# Patient Record
Sex: Male | Born: 1965 | Race: Asian | Hispanic: No | Marital: Married | Smoking: Never smoker
Health system: Southern US, Community
[De-identification: ages and names within clinical notes are randomized; demographics above are authoritative.]

## PROBLEM LIST (undated history)

## (undated) DIAGNOSIS — K5900 Constipation, unspecified: Secondary | ICD-10-CM

## (undated) DIAGNOSIS — R7303 Prediabetes: Secondary | ICD-10-CM

## (undated) DIAGNOSIS — M1990 Unspecified osteoarthritis, unspecified site: Secondary | ICD-10-CM

## (undated) DIAGNOSIS — E291 Testicular hypofunction: Secondary | ICD-10-CM

## (undated) DIAGNOSIS — D352 Benign neoplasm of pituitary gland: Secondary | ICD-10-CM

## (undated) DIAGNOSIS — R1011 Right upper quadrant pain: Secondary | ICD-10-CM

## (undated) DIAGNOSIS — N281 Cyst of kidney, acquired: Secondary | ICD-10-CM

## (undated) DIAGNOSIS — E221 Hyperprolactinemia: Secondary | ICD-10-CM

## (undated) DIAGNOSIS — E785 Hyperlipidemia, unspecified: Secondary | ICD-10-CM

## (undated) DIAGNOSIS — M5030 Other cervical disc degeneration, unspecified cervical region: Secondary | ICD-10-CM

## (undated) DIAGNOSIS — K760 Fatty (change of) liver, not elsewhere classified: Secondary | ICD-10-CM

## (undated) DIAGNOSIS — D497 Neoplasm of unspecified behavior of endocrine glands and other parts of nervous system: Secondary | ICD-10-CM

## (undated) DIAGNOSIS — R221 Localized swelling, mass and lump, neck: Secondary | ICD-10-CM

## (undated) DIAGNOSIS — L719 Rosacea, unspecified: Secondary | ICD-10-CM

## (undated) DIAGNOSIS — E230 Hypopituitarism: Secondary | ICD-10-CM

## (undated) DIAGNOSIS — R7302 Impaired glucose tolerance (oral): Secondary | ICD-10-CM

## (undated) HISTORY — PX: APPENDECTOMY: SHX54

## (undated) HISTORY — DX: Constipation, unspecified: K59.00

## (undated) HISTORY — DX: Prediabetes: R73.03

## (undated) HISTORY — DX: Other cervical disc degeneration, unspecified cervical region: M50.30

## (undated) HISTORY — DX: Hyperlipidemia, unspecified: E78.5

## (undated) HISTORY — DX: Benign neoplasm of pituitary gland: D35.2

## (undated) HISTORY — DX: Cyst of kidney, acquired: N28.1

## (undated) HISTORY — DX: Fatty (change of) liver, not elsewhere classified: K76.0

## (undated) HISTORY — DX: Rosacea, unspecified: L71.9

## (undated) HISTORY — DX: Unspecified osteoarthritis, unspecified site: M19.90

## (undated) HISTORY — DX: Localized swelling, mass and lump, neck: R22.1

## (undated) HISTORY — DX: Impaired glucose tolerance (oral): R73.02

## (undated) HISTORY — DX: Testicular hypofunction: E29.1

## (undated) HISTORY — DX: Hypopituitarism: E23.0

## (undated) HISTORY — PX: TONSILLECTOMY: SUR1361

## (undated) HISTORY — DX: Hyperprolactinemia: E22.1

---

## 2004-08-07 ENCOUNTER — Ambulatory Visit (HOSPITAL_COMMUNITY): Admission: RE | Admit: 2004-08-07 | Discharge: 2004-08-07 | Payer: Self-pay | Admitting: Internal Medicine

## 2010-03-30 ENCOUNTER — Emergency Department: Payer: Self-pay | Admitting: Surgery

## 2010-04-11 ENCOUNTER — Ambulatory Visit: Payer: Self-pay | Admitting: Anesthesiology

## 2010-06-06 ENCOUNTER — Ambulatory Visit: Payer: Self-pay | Admitting: Anesthesiology

## 2011-08-19 DIAGNOSIS — E221 Hyperprolactinemia: Secondary | ICD-10-CM

## 2011-08-19 DIAGNOSIS — D352 Benign neoplasm of pituitary gland: Secondary | ICD-10-CM | POA: Insufficient documentation

## 2011-08-19 DIAGNOSIS — E230 Hypopituitarism: Secondary | ICD-10-CM

## 2011-08-19 HISTORY — DX: Hyperprolactinemia: E22.1

## 2011-08-19 HISTORY — DX: Benign neoplasm of pituitary gland: D35.2

## 2011-08-19 HISTORY — DX: Hypopituitarism: E23.0

## 2013-06-08 DIAGNOSIS — E291 Testicular hypofunction: Secondary | ICD-10-CM | POA: Insufficient documentation

## 2013-06-08 DIAGNOSIS — R7302 Impaired glucose tolerance (oral): Secondary | ICD-10-CM | POA: Insufficient documentation

## 2013-06-08 HISTORY — DX: Impaired glucose tolerance (oral): R73.02

## 2013-06-08 HISTORY — DX: Testicular hypofunction: E29.1

## 2013-06-12 DIAGNOSIS — R221 Localized swelling, mass and lump, neck: Secondary | ICD-10-CM | POA: Insufficient documentation

## 2013-06-12 HISTORY — DX: Localized swelling, mass and lump, neck: R22.1

## 2013-06-13 DIAGNOSIS — E230 Hypopituitarism: Secondary | ICD-10-CM | POA: Insufficient documentation

## 2013-06-13 HISTORY — DX: Hypopituitarism: E23.0

## 2017-01-19 LAB — HM HEPATITIS C SCREENING LAB: HM HEPATITIS C SCREENING: NEGATIVE

## 2017-03-01 ENCOUNTER — Emergency Department
Admission: EM | Admit: 2017-03-01 | Discharge: 2017-03-01 | Disposition: A | Discharge status: Home / Self Care | Payer: BLUE CROSS/BLUE SHIELD | Attending: Emergency Medicine | Admitting: Emergency Medicine

## 2017-03-01 ENCOUNTER — Telehealth: Payer: Self-pay

## 2017-03-01 ENCOUNTER — Other Ambulatory Visit: Payer: Self-pay

## 2017-03-01 ENCOUNTER — Emergency Department: Payer: BLUE CROSS/BLUE SHIELD

## 2017-03-01 ENCOUNTER — Encounter: Payer: Self-pay | Admitting: Emergency Medicine

## 2017-03-01 DIAGNOSIS — G8929 Other chronic pain: Secondary | ICD-10-CM

## 2017-03-01 DIAGNOSIS — R1011 Right upper quadrant pain: Principal | ICD-10-CM

## 2017-03-01 DIAGNOSIS — K529 Noninfective gastroenteritis and colitis, unspecified: Secondary | ICD-10-CM | POA: Diagnosis not present

## 2017-03-01 DIAGNOSIS — Z79899 Other long term (current) drug therapy: Secondary | ICD-10-CM | POA: Diagnosis not present

## 2017-03-01 DIAGNOSIS — Z7984 Long term (current) use of oral hypoglycemic drugs: Secondary | ICD-10-CM | POA: Insufficient documentation

## 2017-03-01 DIAGNOSIS — R1010 Upper abdominal pain, unspecified: Secondary | ICD-10-CM | POA: Diagnosis not present

## 2017-03-01 DIAGNOSIS — R112 Nausea with vomiting, unspecified: Secondary | ICD-10-CM | POA: Insufficient documentation

## 2017-03-01 HISTORY — DX: Neoplasm of unspecified behavior of endocrine glands and other parts of nervous system: D49.7

## 2017-03-01 LAB — TROPONIN I: Troponin I: <0.03 ng/mL (ref <0.03)

## 2017-03-01 LAB — CBC
HCT: 47.3 % (ref 40.0–52.0)
Hemoglobin: 16.3 g/dL (ref 13.0–18.0)
MCH: 29.9 pg (ref 26.0–34.0)
MCHC: 34.3 g/dL (ref 32.0–36.0)
MCV: 87.1 fL (ref 80.0–100.0)
Platelets: 284 10*3/uL (ref 150–440)
RBC: 5.43 MIL/uL (ref 4.40–5.90)
RDW: 13.3 % (ref 11.5–14.5)
WBC: 14.2 10*3/uL — ABNORMAL HIGH (ref 3.8–10.6)

## 2017-03-01 LAB — COMPREHENSIVE METABOLIC PANEL
ALT: 38 U/L (ref 17–63)
AST: 29 U/L (ref 15–41)
Albumin: 4.6 g/dL (ref 3.5–5.0)
Alkaline Phosphatase: 70 U/L (ref 38–126)
Anion gap: 7 (ref 5–15)
BUN: 20 mg/dL (ref 6–20)
CO2: 23 mmol/L (ref 22–32)
Calcium: 9.3 mg/dL (ref 8.9–10.3)
Chloride: 105 mmol/L (ref 101–111)
Creatinine, Ser: 0.95 mg/dL (ref 0.61–1.24)
GFR calc Af Amer: >60 mL/min (ref >60)
GFR calc non Af Amer: >60 mL/min (ref >60)
Glucose, Bld: 147 mg/dL — ABNORMAL HIGH (ref 65–99)
Potassium: 4.3 mmol/L (ref 3.5–5.1)
Sodium: 135 mmol/L (ref 135–145)
Total Bilirubin: 0.8 mg/dL (ref 0.3–1.2)
Total Protein: 7.8 g/dL (ref 6.5–8.1)

## 2017-03-01 LAB — LIPASE, BLOOD: Lipase: 31 U/L (ref 11–51)

## 2017-03-01 MED ORDER — MORPHINE SULFATE (PF) 4 MG/ML IV SOLN
INTRAVENOUS | Status: AC
  Filled 2017-03-01: qty 1

## 2017-03-01 MED ORDER — FENTANYL CITRATE (PF) 100 MCG/2ML IJ SOLN
50.0000 ug | Freq: Once | INTRAMUSCULAR | Status: AC
  Administered 2017-03-01: 50 ug via INTRAVENOUS
  Filled 2017-03-01: qty 2

## 2017-03-01 MED ORDER — ONDANSETRON HCL 4 MG/2ML IJ SOLN
4.0000 mg | Freq: Once | INTRAMUSCULAR | Status: AC
  Administered 2017-03-01: 4 mg via INTRAVENOUS

## 2017-03-01 MED ORDER — IOPAMIDOL (ISOVUE-300) INJECTION 61%
30.0000 mL | Freq: Once | INTRAVENOUS | Status: AC | PRN
  Administered 2017-03-01: 30 mL via ORAL

## 2017-03-01 MED ORDER — HYDROMORPHONE HCL 1 MG/ML IJ SOLN
0.5000 mg | Freq: Once | INTRAMUSCULAR | Status: AC
  Administered 2017-03-01: 0.5 mg via INTRAVENOUS
  Filled 2017-03-01: qty 1

## 2017-03-01 MED ORDER — ONDANSETRON HCL 4 MG PO TABS: 4.0000 mg | ORAL_TABLET | Freq: Three times a day (TID) | ORAL | 0 refills | Status: DC | PRN

## 2017-03-01 MED ORDER — ONDANSETRON HCL 4 MG/2ML IJ SOLN
4.0000 mg | Freq: Once | INTRAMUSCULAR | Status: DC | PRN
  Filled 2017-03-01: qty 2

## 2017-03-01 MED ORDER — IOPAMIDOL (ISOVUE-300) INJECTION 61%
100.0000 mL | Freq: Once | INTRAVENOUS | Status: AC | PRN
  Administered 2017-03-01: 100 mL via INTRAVENOUS

## 2017-03-01 MED ORDER — SODIUM CHLORIDE 0.9 % IV BOLUS (SEPSIS)
1000.0000 mL | Freq: Once | INTRAVENOUS | Status: AC
  Administered 2017-03-01: 1000 mL via INTRAVENOUS

## 2017-03-01 MED ORDER — TRAMADOL HCL 50 MG PO TABS: 50.0000 mg | ORAL_TABLET | Freq: Four times a day (QID) | ORAL | 0 refills | Status: DC | PRN

## 2017-03-01 MED ORDER — MORPHINE SULFATE (PF) 4 MG/ML IV SOLN
4.0000 mg | Freq: Once | INTRAVENOUS | Status: AC
  Administered 2017-03-01: 4 mg via INTRAVENOUS
  Filled 2017-03-01: qty 1

## 2017-03-01 MED ORDER — FAMOTIDINE IN NACL 20-0.9 MG/50ML-% IV SOLN
20.0000 mg | Freq: Once | INTRAVENOUS | Status: AC
  Administered 2017-03-01: 20 mg via INTRAVENOUS
  Filled 2017-03-01: qty 50

## 2017-03-01 MED ORDER — MORPHINE SULFATE (PF) 4 MG/ML IV SOLN
4.0000 mg | Freq: Once | INTRAVENOUS | Status: AC
  Administered 2017-03-01: 4 mg via INTRAVENOUS

## 2017-03-01 NOTE — ED Triage Notes (Signed)
Pt reports waking around 2am with right upper quadrant pain; N/V present; constant squeezing pain; rates 10/10; denies diarrhea; pt says he had a similar episode Friday night that eased after some OTC medications; pt retching in triage;

## 2017-03-01 NOTE — Telephone Encounter (Signed)
Left message for Timothy Atkins [phone]  to call me so I can schedule the Hida scan per Dr. Adonis Huguenin.

## 2017-03-01 NOTE — Consult Note (Signed)
Patient ID: Timothy Atkins, male   DOB: July 24, 1966, 51 y.o.   MRN: [id number]  CC: ABDOMINAL PAIN  HPI Timothy Atkins is a 51 y.o. male who presents to the ER for evaluation of abdominal pain. Patient reports that the pain first occurred on Friday and was followed by some nausea and vomiting which relieved the pain. He then was without pain on Saturday or Sunday. He was awoken from pain early this morning that did not improve. This prompted him to seek care in the emergency department. Prior to this past week and this is never happened to him before and is otherwise in his usual state of health. He denies any fevers, chills, chest pain, shortness of breath. He states he usually has constipation but recently he has been more regular.  HPI  Past Medical History:  Diagnosis Date  . Pituitary tumor     Past Surgical History:  Procedure Laterality Date  . APPENDECTOMY    . TONSILLECTOMY      Family history: Mother and father with diabetes, heart attacks. Mother with high blood pressure. Patient denies any known history of cancers.  Social History Social History  Substance Use Topics  . Smoking status: Never Smoker  . Smokeless tobacco: Never Used  . Alcohol use No    No Known Allergies  Current Facility-Administered Medications  Medication Dose Route Frequency Provider Last Rate Last Dose  . ondansetron (ZOFRAN) injection 4 mg  4 mg Intravenous Once PRN Paulette Blanch, MD       Current Outpatient Prescriptions  Medication Sig Dispense Refill  . amLODipine (NORVASC) 2.5 MG tablet Take 2.5 mg by mouth daily.    Marland Kitchen atorvastatin (LIPITOR) 10 MG tablet Take 10 mg by mouth every evening.    . cabergoline (DOSTINEX) 0.5 MG tablet Take 0.5 mg by mouth 2 (two) times a week.    . human chorionic gonadotropin (PREGNYL/NOVAREL) 10000 units injection Inject 3,000 Units into the muscle 3 (three) times a week.    . meloxicam (MOBIC) 15 MG tablet Take 15 mg by mouth daily.    . ondansetron (ZOFRAN)  4 MG tablet Take 1 tablet (4 mg total) by mouth every 8 (eight) hours as needed for nausea or vomiting. 8 tablet 0  . traMADol (ULTRAM) 50 MG tablet Take 1 tablet (50 mg total) by mouth every 6 (six) hours as needed. 10 tablet 0     Review of Systems A Multi-point review of systems was asked and was negative except for the findings documented in the history of present illness  Physical Exam Blood pressure 137/84, pulse 68, temperature 97.9 F (36.6 C), temperature source Oral, resp. rate 15, height 5\' 11"  (1.803 m), weight 117.9 kg (260 lb), SpO2 96 %. CONSTITUTIONAL: Resting in bed in no acute distress. EYES: Pupils are equal, round, and reactive to light, Sclera are non-icteric. EARS, NOSE, MOUTH AND THROAT: The oropharynx is clear. The oral mucosa is pink and moist. Hearing is intact to voice. LYMPH NODES:  Lymph nodes in the neck are normal. RESPIRATORY:  Lungs are clear. There is normal respiratory effort, with equal breath sounds bilaterally, and without pathologic use of accessory muscles. CARDIOVASCULAR: Heart is regular without murmurs, gallops, or rubs. GI: The abdomen is very large, soft, mildly tender to deep palpation in the right upper quadrant but with a negative Murphy sign, and nondistended. There are no palpable masses. There is no hepatosplenomegaly. There are normal bowel sounds in all quadrants. GU: Rectal deferred.   MUSCULOSKELETAL:  Normal muscle strength and tone. No cyanosis or edema.   SKIN: Turgor is good and there are no pathologic skin lesions or ulcers. NEUROLOGIC: Motor and sensation is grossly normal. Cranial nerves are grossly intact. PSYCH:  Oriented to person, place and time. Affect is normal.  Data Reviewed Images and labs reviewed. Labs show mild leukocytosis of 14.2, the remainder of his labs are all within normal limits to include normal LFTs. He has an ultrasound of the right upper quadrant that shows a persistent polyp but otherwise without any signs  of cholecystitis. No gallbladder wall thickening, pericholecystic fluid, ductal dilatation. CT scan shows some questionable focal colitis in the left upper quadrant. There is no free air, free fluid, abscess visualized on the CT scan. I have personally reviewed the patient's imaging, laboratory findings and medical records.    Assessment    Abdominal pain    Plan    51 year old male with abdominal pain. Given his exam there is question of a gallbladder pathology. However with a normal ultrasound and CT discussed the next imaging study would be a HIDA scan. Operative him observation the hospital to allow for HIDA scan versus outpatient follow-up with either scan. Patient elects for an outpatient follow-up. HIDA scan will be ordered by my clinic today and patient will have follow-up at 1 PM on Wednesday for repeat evaluation. All questions answered to the patient's satisfaction. The patient's care was discussed with the ER physician as well.     Time spent with the patient was 60 minutes, with more than 50% of the time spent in face-to-face education, counseling and care coordination.     Clayburn Pert, MD FACS General Surgeon 03/01/2017, 1:05 PM

## 2017-03-01 NOTE — ED Notes (Signed)
Patient transported to Ultrasound 

## 2017-03-01 NOTE — ED Provider Notes (Addendum)
-----------------------------------------   11:50 AM on 03/01/2017 -----------------------------------------  Patient has had no vomiting, tolerated by mouth, abdomen is completely benign with no tenderness noted. CT scan shows a mild colitis. No evidence of perforation or abscess. Certainly no peritoneal signs. Patient is able tolerate by mouth here. His pain is well-controlled at this time. Ongoing vitals been reassuring. I think he is safe for discharge and he is requesting to go. We will have him follow closely with surgery and PCP. No evidence that this is caused by his gallstones as they are essentially asymptomatic at this time with no right upper quadrant tenderness, negative Murphy sign, no evidence of cholecystitis. Return precautions and follow-up given and understood. He has not yet had diarrhea.  ----------------------------------------- 12:59 PM on 03/01/2017 -----------------------------------------  Given the patient did have a recurrence of pain after by mouth challenge I did ask surgery to come see him. Dr. Clydene Laming and was kind enough to evaluate the patient. We discussed admission versus discharge. Patient very much would like to go home. He declines offered admission. Surgery feels comfortable with this. They have arranged an outpatient HIDA scan for the patient 1:00 in the afternoon on Wednesday. That is 2 days from now. Patient is very comfortable with this plan. Extensive return precautions and follow-up given and understood.   Schuyler Amor, MD 03/01/17 1151    Schuyler Amor, MD 03/01/17 1300

## 2017-03-01 NOTE — ED Provider Notes (Addendum)
[facility] Emergency Department Provider Note   ____________________________________________   First MD Initiated Contact with Patient 03/01/17 [phone]     (approximate)  I have reviewed the triage vital signs and the nursing notes.   HISTORY  Chief Complaint Abdominal Pain and Emesis    HPI Timothy Atkins is a 51 y.o. male who presents to the ED from home with a chief complaint of abdominal pain. Patient awoke around 2 AM with right upper quadrant abdominal pain associated with nausea and vomiting. Describes constant, squeezing pain which is nonradiating.Reports a similar episode 2 nights ago which eased after over-the-counter medications. Denies associated fever, chills, chest pain, shortness of breath, diarrhea. Denies recent travel or trauma. Nothing makes his symptoms better or worse.   Past Medical History:  Diagnosis Date  . Pituitary tumor     There are no active problems to display for this patient.   Past Surgical History:  Procedure Laterality Date  . APPENDECTOMY    . TONSILLECTOMY      Prior to Admission medications   Medication Sig Start Date End Date Taking? Authorizing Provider  amLODipine (NORVASC) 2.5 MG tablet Take 2.5 mg by mouth daily.   Yes [provider]  atorvastatin (LIPITOR) 10 MG tablet Take 10 mg by mouth every evening.   Yes [provider]  cabergoline (DOSTINEX) 0.5 MG tablet Take 0.5 mg by mouth 2 (two) times a week.   Yes [provider]  meloxicam (MOBIC) 15 MG tablet Take 15 mg by mouth daily.   Yes [provider]  metFORMIN (GLUCOPHAGE) 500 MG tablet Take 500 mg by mouth 2 (two) times daily.   Yes [provider]  human chorionic gonadotropin (PREGNYL/NOVAREL) 10000 units injection Inject 3,000 Units into the muscle 3 (three) times a week.    [provider]    Allergies Patient has no known allergies.  History reviewed. No pertinent family  history.  Social History Social History  Substance Use Topics  . Smoking status: Never Smoker  . Smokeless tobacco: Never Used  . Alcohol use No    Review of Systems  Constitutional: No fever/chills. Eyes: No visual changes. ENT: No sore throat. Cardiovascular: Denies chest pain. Respiratory: Denies shortness of breath. Gastrointestinal: Positive for abdominal pain, nausea and vomiting.  No diarrhea.  No constipation. Genitourinary: Negative for dysuria. Musculoskeletal: Negative for back pain. Skin: Negative for rash. Neurological: Negative for headaches, focal weakness or numbness.   ____________________________________________   PHYSICAL EXAM:  VITAL SIGNS: ED Triage Vitals [03/01/17 0527]  Enc Vitals Group     BP (!) 164/99     Pulse Rate 66     Resp 18     Temp 97.9 F (36.6 C)     Temp Source Oral     SpO2 100 %     Weight 260 lb (117.9 kg)     Height 5\' 11"  (1.803 m)     Head Circumference      Peak Flow      Pain Score 10     Pain Loc      Pain Edu?      Excl. in [place]?     Constitutional: Alert and oriented. Uncomfortable appearing and in mild acute distress. Eyes: Conjunctivae are normal. PERRL. EOMI. Head: Atraumatic. Nose: No congestion/rhinnorhea. Mouth/Throat: Mucous membranes are moist.  Oropharynx non-erythematous. Neck: No stridor.   Cardiovascular: Normal rate, regular rhythm. Grossly normal heart sounds.  Good peripheral circulation. Respiratory: Normal respiratory effort.  No retractions. Lungs CTAB. Gastrointestinal: Soft and tender to palpation epigastrium and right upper quadrant without rebound or guarding. No distention. No abdominal bruits. No CVA tenderness. Musculoskeletal: No lower extremity tenderness nor edema.  No joint effusions. Neurologic:  Normal speech and language. No gross focal neurologic deficits are appreciated. No gait instability. Skin:  Skin is warm, dry and intact. No rash noted. Psychiatric: Mood and affect are  normal. Speech and behavior are normal.  ____________________________________________   LABS (all labs ordered are listed, but only abnormal results are displayed)  Labs Reviewed  COMPREHENSIVE METABOLIC PANEL - Abnormal; Notable for the following:       Result Value   Glucose, Bld 147 (*)    All other components within normal limits  CBC - Abnormal; Notable for the following:    WBC 14.2 (*)    All other components within normal limits  LIPASE, BLOOD   ____________________________________________  EKG  ED ECG REPORT I, SUNG,JADE J, the attending physician, personally viewed and interpreted this ECG.   Date: 03/01/2017  EKG Time: 0625  Rate: 61  Rhythm: normal EKG, normal sinus rhythm  Axis: Normal  Intervals:nonspecific intraventricular conduction delay  ST&T Change: Nonspecific  ____________________________________________  RADIOLOGY  Abdominal ultrasound interpreted per Dr. Jasmine December: 9 mm presumed polyp in the gallbladder. This finding warrants a  follow-up ultrasound in 1 year to further assess. Gallbladder  otherwise appears unremarkable.    Increased liver echogenicity, likely due to hepatic steatosis. While  no focal liver lesions are evident, it must be cautioned that the  sensitivity of ultrasound for detection of focal liver lesions is  diminished in this circumstance.    Cyst in right kidney.    ____________________________________________   PROCEDURES  Procedure(s) performed: None  Procedures  Critical Care performed: No  ____________________________________________   INITIAL IMPRESSION / ASSESSMENT AND PLAN / ED COURSE  Pertinent labs & imaging results that were available during my care of the patient were reviewed by me and considered in my medical decision making (see chart for details).  51 year old male who presents with upper abdominal pain associated with nausea and vomiting. Pain screening lab work including LFTs, lipase.  Initiate IV fluid resuscitation, IV analgesia and reassess. Will proceed with ultrasound of right upper quadrant to evaluate for cholecystitis.  Clinical Course as of Mar 01 816  Mon Mar 01, 2017  0744 Patient awaiting results of ultrasound. Resting in no acute distress.   [JS]  [id number] Updated patient and his spouse of ultrasound results. Patient states his pain has returned and is worse after Dilaudid. Will try fentanyl, Pepcid and proceed to CT abdomen/pelvis to evaluate intra-abdominal etiology. Care transferred to Dr. Burlene Arnt pending CT results and disposition.  [JS]    Clinical Course User Index [JS] Paulette Blanch, MD     ____________________________________________   FINAL CLINICAL IMPRESSION(S) / ED DIAGNOSES  Final diagnoses:  Pain of upper abdomen  Nausea and vomiting, intractability of vomiting not specified, unspecified vomiting type      NEW MEDICATIONS STARTED DURING THIS VISIT:  New Prescriptions   No medications on file     Note:  This document was prepared using Dragon voice recognition software and may include unintentional dictation errors.    Paulette Blanch, MD 03/01/17 5188    Paulette Blanch, MD 03/01/17 [phone]

## 2017-03-01 NOTE — Discharge Instructions (Signed)
Return to the emergency room for any new or worrisome symptoms including increased pain, vomiting, fever, or you feel worse in any way. Follow closely with primary care doctor and surgery.

## 2017-03-01 NOTE — ED Notes (Signed)
Pt given gingerale and graham crackers for PO challenge  

## 2017-03-01 NOTE — Telephone Encounter (Signed)
Called over to schedule appointment for HIDA scan for patient per Dr. Adonis Huguenin with scheduling. Spoke to the receptionist and was told that Timothy Atkins was currently on the phone and left a message telling her to have Timothy Atkins call me back so that I could get the patient on the schedule for his scan tomorrow. Was told previously by Timothy Atkins in an earlier phone call that they had an available appointment at [facility] for tomorrow for his HIDA scan. She was awaiting for pre-authorization to be done and I was trying to contact her since this has been completed.

## 2017-03-01 NOTE — Telephone Encounter (Signed)
Contacted patient at this time to find out if he would be okay with getting a HIDA scan at [facility]. He stayed that he would be okay with that. I told him that I currently didn't have a appointment for the scan due to pre-authorization being needed for the scan to be covered. And that as soon as I was given an appointment time that I would call him. Currently have not been able to schedule him in for the HIDA scan that Dr. Adonis Huguenin wants done on 03/02/17 so that we can see the patient in office on 03/03/17.

## 2017-03-02 ENCOUNTER — Telehealth: Payer: Self-pay

## 2017-03-02 NOTE — Telephone Encounter (Signed)
Patient is calling in regards to his [facility] appointment. I have read through the notes but am not seeing that his has been scheduled for the patient. Also, Dr. Adonis Huguenin will not be back on Clinic days until May 17. Please call patient and advice.

## 2017-03-02 NOTE — Telephone Encounter (Signed)
Contacted patient at this time. Gave patient his appointment information for his HIDA Scan that he will have on Monday 03/08/17 at 7:15 at the [facility]. I told him to not eat, drink and not to take any pain medication after 12. I also told him that we will follow-up with him in office on 03/10/17 at 11:00 to go over his scan results. Patient verbalized understanding.

## 2017-03-02 NOTE — Telephone Encounter (Signed)
Spoke with Manuela Schwartz at [employer] the earliest she was able to schedule him was on Monday at 7:30 at [facility].   Scheduling this scan has been prolonged due to having to get authorization from his insurance before they would schedule it.  Dr. Adonis Huguenin had original asked for this to be scheduled on 5/8 so that he could see the patient in office on 5/9. Seeing that patient was seen in the ED on 5/7 with upper right abdominal pain.

## 2017-03-08 ENCOUNTER — Encounter
Admission: RE | Admit: 2017-03-08 | Discharge: 2017-03-08 | Disposition: A | Discharge status: Home / Self Care | Payer: BLUE CROSS/BLUE SHIELD | Source: Ambulatory Visit | Attending: General Surgery | Admitting: General Surgery

## 2017-03-08 DIAGNOSIS — R1011 Right upper quadrant pain: Secondary | ICD-10-CM | POA: Diagnosis not present

## 2017-03-08 DIAGNOSIS — G8929 Other chronic pain: Secondary | ICD-10-CM | POA: Diagnosis present

## 2017-03-08 HISTORY — DX: Right upper quadrant pain: R10.11

## 2017-03-08 MED ORDER — TECHNETIUM TC 99M MEBROFENIN IV KIT
2.0000 | PACK | Freq: Once | INTRAVENOUS | Status: AC | PRN
  Administered 2017-03-08: 1.95 via INTRAVENOUS

## 2017-03-08 MED ORDER — MORPHINE SULFATE (PF) 4 MG/ML IV SOLN
3.0000 mg | Freq: Once | INTRAVENOUS | Status: AC
Start: 2017-03-08 — End: 2017-03-08
  Administered 2017-03-08: 3 mg via INTRAVENOUS
  Filled 2017-03-08: qty 0.8

## 2017-03-08 MED ORDER — TECHNETIUM TC 99M MEBROFENIN IV KIT
4.9230 | PACK | Freq: Once | INTRAVENOUS | Status: AC | PRN
  Administered 2017-03-08: 4.923 via INTRAVENOUS

## 2017-03-09 ENCOUNTER — Other Ambulatory Visit: Payer: Self-pay

## 2017-03-10 ENCOUNTER — Telehealth: Payer: Self-pay

## 2017-03-10 ENCOUNTER — Encounter: Payer: Self-pay | Admitting: General Surgery

## 2017-03-10 ENCOUNTER — Ambulatory Visit (INDEPENDENT_AMBULATORY_CARE_PROVIDER_SITE_OTHER): Payer: BLUE CROSS/BLUE SHIELD | Admitting: General Surgery

## 2017-03-10 VITALS — BP 145/88 | HR 64 | Temp 98.4°F | Ht 71.0 in | Wt 251.0 lb

## 2017-03-10 DIAGNOSIS — K828 Other specified diseases of gallbladder: Secondary | ICD-10-CM | POA: Insufficient documentation

## 2017-03-10 DIAGNOSIS — R1010 Upper abdominal pain, unspecified: Secondary | ICD-10-CM | POA: Diagnosis not present

## 2017-03-10 NOTE — Progress Notes (Signed)
Outpatient Surgical Follow Up  03/10/2017  Timothy Atkins is an 51 y.o. male.   Chief Complaint  Patient presents with  . Follow-up    Abdominal Pain    HPI: 51 year old male returns for follow-up after being seen in the emergency department for abdominal pain. Since that time he was prescribed antibiotics by his primary care for treatment of colitis. He had a HIDA scan for further evaluation of his gallbladder which showed a depressed ejection fraction to 9%. He states the symptoms of problems the emergency room though have completely resolved. He denies any fevers, chills, nausea, vomiting, chest pain, shortness of breath, diarrhea, constipation. He again described the episodes in detail. They awoken from sleep at 2 AM after eating dinner at about 8:30. He currently states he is feeling well  Past Medical History:  Diagnosis Date  . Glucose intolerance (impaired glucose tolerance) 06/08/2013  . 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 Invasive pituitary adenoma ([place]) 08/19/2011  . Male hypogonadism 06/08/2013  . Neck nodule 06/12/2013  . 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 Pituitary tumor   . RUQ pain 03/08/2017   Post RUQ that radiates Ant RUQ. 3 severe episodes over 3 weeks    Past Surgical History:  Procedure Laterality Date  . APPENDECTOMY  as child  . TONSILLECTOMY  as child    Family History  Problem Relation Age of Onset  . Healthy Mother   . Healthy Father     Social History:  reports that he has never smoked. He has never used smokeless tobacco. He reports that he does not drink alcohol or use drugs.  Allergies: No Known Allergies  Medications reviewed.    ROS A multipoint review of systems was completed, all pertinent positives and negatives are documented within the history of present illness and remainder are negative   BP (!) 145/88   Pulse 64   Temp  98.4 F (36.9 C) (Oral)   Ht 5\' 11"  (1.803 m)   Wt 113.9 kg (251 lb)   BMI 35.01 kg/m   Physical Exam Gen.: No acute distress Neck: Supple and nontender Chest: Clear to auscultation Heart: Regular rate and rhythm Abdomen: Large, soft, nontender. Extremities: Moves all extremities well.    No results found for this or any previous visit (from the past 48 hour(s)). Nm Hepato W/eject Fract  Result Date: 03/08/2017 CLINICAL DATA:  Chronic right upper quadrant abdominal pain EXAM: NUCLEAR MEDICINE HEPATOBILIARY IMAGING WITH GALLBLADDER EF TECHNIQUE: Sequential images of the abdomen were obtained out to 60 minutes following intravenous administration of radiopharmaceutical. After oral ingestion of 8 ounces of Ensure, gallbladder ejection fraction was determined. RADIOPHARMACEUTICALS:  6.8 mCi Tc-42m Choletec IV COMPARISON:  None. FINDINGS: There is normal hepatic uptake and biliary excretion of the radiotracer. Activity progresses into small bowel compatible with patency of the common bile duct. Gallbladder was not visualized at 95 minutes after the injection. Therefore, 3 mg morphine administered. Post morphine, the gallbladder is visualized therefore the cystic duct is also patent. No evidence of cholecystitis. Calculated gallbladder ejection fraction is 9%%. (Normal gallbladder ejection fraction with half-and-half is greater than 33%.) IMPRESSION: Patent cystic duct and common bile duct. Negative for cholecystitis. Biliary dyskinesia.  Calculated ejection fraction 9% Electronically Signed   By: Jerilynn Mages.  Shick M.D.   On: 03/08/2017 13:11    Assessment/Plan:  1. Biliary dyskinesia 51 year old male with a HIDA scan proven biliary dyskinesia versus chronic  cholecystitis. Patient is currently asymptomatic from this. He wishes to rule out all other possible causes of his abdominal pain prior to proceeding with surgery. The laparoscopic cholecystectomy was described briefly but not in detail as the  patient is not currently ready to proceed with surgery. Patient will return to clinic after being evaluated by GI if no other cause of his pain is identified.  2. Pain of upper abdomen Patient had atypical abdominal pain with the story not 100% consistent with biliary colic. CT scan performed in the emergency room showed a questionable colitis. Due to this finding and the fact the patient is better after being treated on antibiotics discussed the possibility of performing endoscopies prior to having his gallbladder removed. He desires to proceed with a gastroenterology referral for further evaluation prior to surgery.  A total of 25 minutes was used on this encounter with 50% used for counseling or coordination of care.    Clayburn Pert, MD FACS General Surgeon  03/10/2017,11:40 AM

## 2017-03-10 NOTE — Telephone Encounter (Signed)
I have put in an internal referral to [facility].   I will follow up within 3-5 days to make sure the appointments have been scheduled.

## 2017-03-10 NOTE — Patient Instructions (Signed)
We have sent over a referral to [facility] for your Endoscopy and Colonoscopy. You will hear from either our office or their office sometime today with an appointment.  We will see you in the office after this has been done to discuss findings and discuss surgery plan if this is still indicated.  I have included some information in regards to the surgery and chronic inflammation of the gallbladder for your reading.   Cholecystitis Cholecystitis is inflammation of the gallbladder. It is often called a gallbladder attack. The gallbladder is a pear-shaped organ that lies beneath the liver on the right side of the body. The gallbladder stores bile, which is a fluid that helps the body to digest fats. If bile builds up in your gallbladder, your gallbladder becomes inflamed. This condition may occur suddenly (be acute). Repeat episodes of acute cholecystitis or prolonged episodes may lead to a long-term (chronic) condition. Cholecystitis is serious and it requires treatment. What are the causes? The most common cause of this condition is gallstones. Gallstones can block the tube (duct) that carries bile out of your gallbladder. This causes bile to build up. Other causes of this condition include:  Damage to the gallbladder due to a decrease in blood flow.  Infections in the bile ducts.  Scars or kinks in the bile ducts.  Tumors in the liver, pancreas, or gallbladder. What increases the risk? This condition is more likely to develop in:  People who have sickle cell disease.  People who take birth control pills or use estrogen.  People who have alcoholic liver disease.  People who have liver cirrhosis.  People who have their nutrition delivered through a vein (parenteral nutrition).  People who do not eat or drink (do fasting) for a long period of time.  People who are obese.  People who have rapid weight loss.  People who are pregnant.  People who have increased triglyceride  levels.  People who have pancreatitis. What are the signs or symptoms? Symptoms of this condition include:  Abdominal pain, especially in the upper right area of the abdomen.  Abdominal tenderness or bloating.  Nausea.  Vomiting.  Fever.  Chills.  Yellowing of the skin and the whites of the eyes (jaundice). How is this diagnosed? This condition is diagnosed with a medical history and physical exam. You may also have other tests, including:  Imaging tests, such as:  An ultrasound of the gallbladder.  A CT scan of the abdomen.  A gallbladder nuclear scan (HIDA scan). This scan allows your health care provider to see the bile moving from your liver to your gallbladder and to your small intestine.  MRI.  Blood tests, such as:  A complete blood count, because the white blood cell count may be higher than normal.  Liver function tests, because some levels may be higher than normal with certain types of gallstones. How is this treated? Treatment may include:  Fasting for a certain amount of time.  IV fluids.  Medicine to treat pain or vomiting.  Antibiotic medicine.  Surgery to remove your gallbladder (cholecystectomy). This may happen immediately or at a later time. Follow these instructions at home: Home care will depend on your treatment. In general:  Take over-the-counter and prescription medicines only as told by your health care provider.  If you were prescribed an antibiotic medicine, take it as told by your health care provider. Do not stop taking the antibiotic even if you start to feel better.  Follow instructions from your  health care provider about what to eat or drink. When you are allowed to eat, avoid eating or drinking anything that triggers your symptoms.  Keep all follow-up visits as told by your health care provider. This is important. Contact a health care provider if:  Your pain is not controlled with medicine.  You have a fever. Get help  right away if:  Your pain moves to another part of your abdomen or to your back.  You continue to have symptoms or you develop new symptoms even with treatment. This information is not intended to replace advice given to you by your health care provider. Make sure you discuss any questions you have with your health care provider. Document Released: 10/12/2005 Document Revised: 02/20/2016 Document Reviewed: 01/23/2015 Elsevier Interactive Patient Education  2017 [employer]   Laparoscopic Cholecystectomy Laparoscopic cholecystectomy is surgery to remove the gallbladder. The gallbladder is a pear-shaped organ that lies beneath the liver on the right side of the body. The gallbladder stores bile, which is a fluid that helps the body to digest fats. Cholecystectomy is often done for inflammation of the gallbladder (cholecystitis). This condition is usually caused by a buildup of gallstones (cholelithiasis) in the gallbladder. Gallstones can block the flow of bile, which can result in inflammation and pain. In severe cases, emergency surgery may be required. This procedure is done though small incisions in your abdomen (laparoscopic surgery). A thin scope with a camera (laparoscope) is inserted through one incision. Thin surgical instruments are inserted through the other incisions. In some cases, a laparoscopic procedure may be turned into a type of surgery that is done through a larger incision (open surgery). Tell a health care provider about:  Any allergies you have.  All medicines you are taking, including vitamins, herbs, eye drops, creams, and over-the-counter medicines.  Any problems you or family members have had with anesthetic medicines.  Any blood disorders you have.  Any surgeries you have had.  Any medical conditions you have.  Whether you are pregnant or may be pregnant. What are the risks? Generally, this is a safe procedure. However, problems may occur,  including:  Infection.  Bleeding.  Allergic reactions to medicines.  Damage to other structures or organs.  A stone remaining in the common bile duct. The common bile duct carries bile from the gallbladder into the small intestine.  A bile leak from the cyst duct that is clipped when your gallbladder is removed. What happens before the procedure? Staying hydrated  Follow instructions from your health care provider about hydration, which may include:  Up to 2 hours before the procedure - you may continue to drink clear liquids, such as water, clear fruit juice, black coffee, and plain tea. Eating and drinking restrictions  Follow instructions from your health care provider about eating and drinking, which may include:  8 hours before the procedure - stop eating heavy meals or foods such as meat, fried foods, or fatty foods.  6 hours before the procedure - stop eating light meals or foods, such as toast or cereal.  6 hours before the procedure - stop drinking milk or drinks that contain milk.  2 hours before the procedure - stop drinking clear liquids. Medicines   Ask your health care provider about:  Changing or stopping your regular medicines. This is especially important if you are taking diabetes medicines or blood thinners.  Taking medicines such as aspirin and ibuprofen. These medicines can thin your blood. Do not take these medicines before  your procedure if your health care provider instructs you not to.  You may be given antibiotic medicine to help prevent infection. General instructions   Let your health care provider know if you develop a cold or an infection before surgery.  Plan to have someone take you home from the hospital or clinic.  Ask your health care provider how your surgical site will be marked or identified. What happens during the procedure?  To reduce your risk of infection:  Your health care team will wash or sanitize their hands.  Your skin  will be washed with soap.  Hair may be removed from the surgical area.  An IV tube may be inserted into one of your veins.  You will be given one or more of the following:  A medicine to help you relax (sedative).  A medicine to make you fall asleep (general anesthetic).  A breathing tube will be placed in your mouth.  Your surgeon will make several small cuts (incisions) in your abdomen.  The laparoscope will be inserted through one of the small incisions. The camera on the laparoscope will send images to a TV screen (monitor) in the operating room. This lets your surgeon see inside your abdomen.  Air-like gas will be pumped into your abdomen. This will expand your abdomen to give the surgeon more room to perform the surgery.  Other tools that are needed for the procedure will be inserted through the other incisions. The gallbladder will be removed through one of the incisions.  Your common bile duct may be examined. If stones are found in the common bile duct, they may be removed.  After your gallbladder has been removed, the incisions will be closed with stitches (sutures), staples, or skin glue.  Your incisions may be covered with a bandage (dressing). The procedure may vary among health care providers and hospitals. What happens after the procedure?  Your blood pressure, heart rate, breathing rate, and blood oxygen level will be monitored until the medicines you were given have worn off.  You will be given medicines as needed to control your pain.  Do not drive for 24 hours if you were given a sedative. This information is not intended to replace advice given to you by your health care provider. Make sure you discuss any questions you have with your health care provider. Document Released: 10/12/2005 Document Revised: 05/03/2016 Document Reviewed: 03/30/2016 Elsevier Interactive Patient Education  2017 [employer].

## 2017-03-19 NOTE — Telephone Encounter (Signed)
I do not see appointment has been made at either [facility] or [facility] at this time.  Call made to patient and patient's wife. No answer on either phone. Message left for patient. Unable to leave a message on patient's wife phone.

## 2017-04-20 NOTE — Telephone Encounter (Signed)
[facility] have been unable to contact the patient. Messages were left

## 2017-07-19 IMAGING — NM NM HEPATO W/GB/PHARM/[PERSON_NAME]
5 series · 20 of 20 positions shown · non-contrast
Comparison: None.

CLINICAL DATA: Chronic right upper quadrant abdominal pain

EXAM:
NUCLEAR MEDICINE HEPATOBILIARY IMAGING WITH GALLBLADDER EF
TECHNIQUE: Sequential images of the abdomen were obtained [DATE] minutes
following intravenous administration of radiopharmaceutical. After
oral ingestion of 8 ounces of Ensure, gallbladder ejection fraction
was determined.
RADIOPHARMACEUTICALS:  6.8 mCi [id number]-VVm Choletec IV

[Series 1000: hepatobiliary scan · 9.59mm/px · 6 of 34 frames shown]
[frame 3/34]
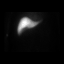
[frame 9/34]
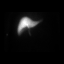
[frame 15/34]
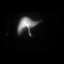
[frame 20/34]
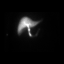
[frame 26/34]
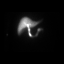
[frame 32/34]
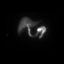

[Series 1000: gallbladder delays · 3.30mm/px · 1 of 1 slices shown (1 of 2)]
[im 1/1]
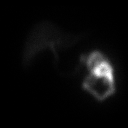

[Series 1000: gallbladder delays · 3.30mm/px · 1 of 1 slices shown (2 of 2)]
[im 1/1]
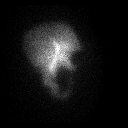

[Series 1000: hepatobiliary scan post morphine · 9.59mm/px · 6 of 30 frames shown]
[frame 3/30]
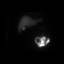
[frame 8/30]
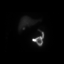
[frame 13/30]
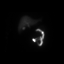
[frame 18/30]
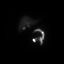
[frame 23/30]
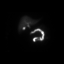
[frame 28/30]
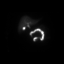

[Series 1000: gallbladder ef · 4.80mm/px · 6 of 120 frames shown]
[frame 11/120]
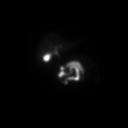
[frame 31/120]
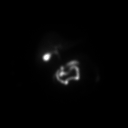
[frame 51/120]
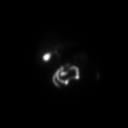
[frame 71/120]
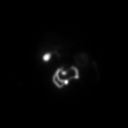
[frame 91/120]
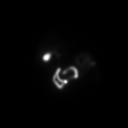
[frame 111/120]
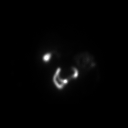

[20 of 20 positions shown; findings below may reference images not displayed]

FINDINGS: There is normal hepatic uptake and biliary excretion of the
radiotracer. Activity progresses into small bowel compatible with
patency of the common bile duct. Gallbladder was not visualized at
95 minutes after the injection. Therefore, 3 mg morphine
administered. Post morphine, the gallbladder is visualized therefore
the cystic duct is also patent. No evidence of cholecystitis.

Calculated gallbladder ejection fraction is 9%%. (Normal gallbladder
ejection fraction with half-and-half is greater than 33%.)
IMPRESSION: Patent cystic duct and common bile duct. Negative for cholecystitis.

Biliary dyskinesia.  Calculated ejection fraction 9%

## 2017-12-13 ENCOUNTER — Ambulatory Visit: Payer: BLUE CROSS/BLUE SHIELD | Admitting: Internal Medicine

## 2017-12-14 ENCOUNTER — Other Ambulatory Visit: Payer: Self-pay | Admitting: Internal Medicine

## 2018-01-31 ENCOUNTER — Ambulatory Visit: Payer: BLUE CROSS/BLUE SHIELD | Admitting: Internal Medicine

## 2018-01-31 ENCOUNTER — Encounter: Payer: Self-pay | Admitting: Internal Medicine

## 2018-01-31 VITALS — BP 116/70 | HR 69 | Temp 98.4°F | Ht 70.0 in | Wt 236.0 lb

## 2018-01-31 DIAGNOSIS — D352 Benign neoplasm of pituitary gland: Secondary | ICD-10-CM | POA: Diagnosis not present

## 2018-01-31 DIAGNOSIS — E785 Hyperlipidemia, unspecified: Secondary | ICD-10-CM

## 2018-01-31 DIAGNOSIS — Z23 Encounter for immunization: Secondary | ICD-10-CM

## 2018-01-31 DIAGNOSIS — Z1329 Encounter for screening for other suspected endocrine disorder: Secondary | ICD-10-CM | POA: Diagnosis not present

## 2018-01-31 DIAGNOSIS — Z1389 Encounter for screening for other disorder: Secondary | ICD-10-CM

## 2018-01-31 DIAGNOSIS — Z1159 Encounter for screening for other viral diseases: Secondary | ICD-10-CM

## 2018-01-31 DIAGNOSIS — J309 Allergic rhinitis, unspecified: Secondary | ICD-10-CM | POA: Insufficient documentation

## 2018-01-31 DIAGNOSIS — J3489 Other specified disorders of nose and nasal sinuses: Secondary | ICD-10-CM

## 2018-01-31 DIAGNOSIS — R7303 Prediabetes: Secondary | ICD-10-CM

## 2018-01-31 DIAGNOSIS — E291 Testicular hypofunction: Secondary | ICD-10-CM

## 2018-01-31 DIAGNOSIS — K760 Fatty (change of) liver, not elsewhere classified: Secondary | ICD-10-CM | POA: Insufficient documentation

## 2018-01-31 DIAGNOSIS — M1990 Unspecified osteoarthritis, unspecified site: Secondary | ICD-10-CM | POA: Diagnosis not present

## 2018-01-31 DIAGNOSIS — D369 Benign neoplasm, unspecified site: Secondary | ICD-10-CM

## 2018-01-31 DIAGNOSIS — Z0184 Encounter for antibody response examination: Secondary | ICD-10-CM

## 2018-01-31 MED ORDER — MUPIROCIN CALCIUM 2 % NA OINT: 1.0000 "application " | TOPICAL_OINTMENT | Freq: Two times a day (BID) | NASAL | 0 refills | Status: DC

## 2018-01-31 NOTE — Progress Notes (Signed)
Pre visit review using our clinic review tool, if applicable. No additional management support is needed unless otherwise documented below in the visit note. 

## 2018-01-31 NOTE — Progress Notes (Addendum)
Chief Complaint  Patient presents with  . Establish Care   Establish former pt Dr. [place]  1. C/o arthritis in b/l shoulders and right knee he does wear knee brace which helps and given Meloxicam 10 mg from [place] he just recently went to visit. He also reports he pulled his right hamstring Pain 7/10 at times.   2. Abdominal pain resolved for now  3. BP improved he is no longer taking norvasc 2.5 as it made him sleepy and he has lost weight and BP improves  4. Allergic rhinitis takes Flonase prn which helps and prn Zyrtec 10 mg and he was given levaquin x 5 days in [place] which helped.  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 [facility] peripheral vision ok and he reports he has not had a repeat MRI  Review of Systems  Constitutional: Positive for weight loss.  HENT:       Sore In left nose  Red rash to face improved   Eyes: Negative for blurred vision.       Peripheral vision ok   Respiratory: Negative for shortness of breath.   Cardiovascular: Negative for chest pain.  Gastrointestinal: Negative for abdominal pain.  Musculoskeletal: Positive for joint pain. Negative for falls.  Skin:       Sore in left nose   Neurological: Negative for headaches.  Psychiatric/Behavioral: Negative for depression.   Past Medical History:  Diagnosis Date  . Glucose intolerance (impaired glucose tolerance) 06/08/2013  . Hyperprolactinemia ([place]) 08/19/2011   Overview:  05/2004 1363 ng/ml  . 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 Male hypogonadism 06/08/2013  . Neck nodule 06/12/2013  . 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 Pituitary tumor   . RUQ pain 03/08/2017   Post RUQ that radiates Ant RUQ. 3 severe episodes over 3 weeks   Past Surgical History:  Procedure Laterality Date  . APPENDECTOMY  as child  .  TONSILLECTOMY  as child   Family History  Problem Relation Age of Onset  . Healthy Mother   . Healthy Father    Social History   Socioeconomic History  . Marital status: Married    Spouse name: Not on file  . Number of children: Not on file  . Years of education: Not on file  . Highest education level: Not on file  Occupational History  . Not on file  Social Needs  . Financial resource strain: Not on file  . Food insecurity:    Worry: Not on file    Inability: Not on file  . Transportation needs:    Medical: Not on file    Non-medical: Not on file  Tobacco Use  . Smoking status: Never Smoker  . Smokeless tobacco: Never Used  Substance and Sexual Activity  . Alcohol use: No  . Drug use: No  . Sexual activity: Not on file  Lifestyle  . Physical activity:    Days per week: Not on file    Minutes per session: Not on file  . Stress: Not on file  Relationships  . Social connections:    Talks on phone: Not on file    Gets together: Not on file    Attends religious service: Not on file    Active member of club or organization: Not on file    Attends meetings of  clubs or organizations: Not on file    Relationship status: Not on file  . Intimate partner violence:    Fear of current or ex partner: Not on file    Emotionally abused: Not on file    Physically abused: Not on file    Forced sexual activity: Not on file  Other Topics Concern  . Not on file  Social History Narrative  . Not on file   Current Meds  Medication Sig  . cabergoline (DOSTINEX) 0.5 MG tablet Take 0.5 mg by mouth 2 (two) times a week.  . cetirizine (ZYRTEC) 10 MG tablet Take 10 mg by mouth.  . human chorionic gonadotropin (PREGNYL/NOVAREL) 10000 units injection Inject 3,000 Units into the muscle 3 (three) times a week.   No Known Allergies No results found for this or any previous visit (from the past 2160 hour(s)). Objective  Body mass index is 33.86 kg/m. Wt Readings from Last 3 Encounters:    01/31/18 236 lb (107 kg)  03/10/17 251 lb (113.9 kg)  03/01/17 260 lb (117.9 kg)   Temp Readings from Last 3 Encounters:  01/31/18 98.4 F (36.9 C) (Oral)  03/10/17 98.4 F (36.9 C) (Oral)  03/01/17 97.9 F (36.6 C) (Oral)   BP Readings from Last 3 Encounters:  01/31/18 116/70  03/10/17 (!) 145/88  03/08/17 111/60   Pulse Readings from Last 3 Encounters:  01/31/18 69  03/10/17 64  03/08/17 62    Physical Exam  Constitutional: He is oriented to person, place, and time. Vital signs are normal. He appears well-developed and well-nourished.  HENT:  Head: Normocephalic and atraumatic.  Nose:    Mouth/Throat: Oropharynx is clear and moist and mucous membranes are normal.  Eyes: Pupils are equal, round, and reactive to light. Conjunctivae are normal.  Cardiovascular: Normal rate, regular rhythm and normal heart sounds.  Pulmonary/Chest: Effort normal and breath sounds normal.  Abdominal: Soft. Bowel sounds are normal. There is no tenderness.  Neurological: He is alert and oriented to person, place, and time. Gait normal.  Skin: Skin is warm, dry and intact.  Improved redness to face   Psychiatric: He has a normal mood and affect. His speech is normal and behavior is normal. Judgment and thought content normal. Cognition and memory are normal.  Nursing note and vitals reviewed.   Assessment   1. Arthritis shoulders, right knee  2. Fatty liver  3. HTN improved w/o meds with wt loss  4. Allergic rhinitis  5. Left nare with sore  6. Pit. Macroadenoma 7. HM  Plan   1.  Established with ortho f/u prn, has mobic prn at home  2.  Will need hep A/B vaccine if not had control risk factors  3. Off norvasc 2.5 mg qd  4. Prn Flonase, zyrtec  5. Trial of bactroban if does not help will do Doxy 6. F/u [facility] cont meds  Likely needs repeat MRI  Check testosterone, TSH, T4 FSH, LH ,prolactin 7.  Never had flu shot  Tdap given today  Per old notes needs hep B vaccine    Check labs CMET, CBC, UA, A1C, lipid, labs as above, hep B titer  Congratulated wt loss and needs to cont.   Reviewed records [facility]  CXR 01/15/17 neg  Right knee Xray 01/15/17 mild medial femortibial OA small knee effusion  Reviewed labs 01/19/17:  UA cloudy neg blood Total cholesterol 218, TG 124, HDL 47, LDL 146,  A1C 5.7 prediabetse  Hep B sAb qual neg,  hep B sAg neg  10/15/16 testosterone low 68 total, free test low 10.6 SHBG 19 wnl, prolactin 1.6 low, TSH nl, T4 nl, cortisol am 6.3 normal   Follows with [facility]  Provider: Dr. Olivia Mackie [facility]

## 2018-01-31 NOTE — Patient Instructions (Signed)
Please f/u in 4 months sooner if needed  Use bactroban to nose 2x per day x 5 days and let me know if not better we will try Doxycycline then  Schedule fasting labs no food x 12 hours only water and medications  DTaP Vaccine (Diphtheria, Tetanus, and Pertussis): What You Need to Know 1. Why get vaccinated? Diphtheria, tetanus, and pertussis are serious diseases caused by bacteria. Diphtheria and pertussis are spread from person to person. Tetanus enters the body through cuts or wounds. DIPHTHERIA causes a thick covering in the back of the throat.  It can lead to breathing problems, paralysis, heart failure, and even death.  TETANUS (Lockjaw) causes painful tightening of the muscles, usually all over the body.  It can lead to "locking" of the jaw so the victim cannot open his mouth or swallow. Tetanus leads to death in up to 2 out of 10 cases.  PERTUSSIS (Whooping Cough) causes coughing spells so bad that it is hard for infants to eat, drink, or breathe. These spells can last for weeks.  It can lead to pneumonia, seizures (jerking and staring spells), brain damage, and death.  Diphtheria, tetanus, and pertussis vaccine (DTaP) can help prevent these diseases. Most children who are vaccinated with DTaP will be protected throughout childhood. Many more children would get these diseases if we stopped vaccinating. DTaP is a safer version of an older vaccine called DTP. DTP is no longer used in the [place]. 2. Who should get DTaP vaccine and when? Children should get 5 doses of DTaP vaccine, one dose at each of the following ages:  2 months  4 months  6 months  15-18 months  4-6 years  DTaP may be given at the same time as other vaccines. 3. Some children should not get DTaP vaccine or should wait  Children with minor illnesses, such as a cold, may be vaccinated. But children who are moderately or severely ill should usually wait until they recover before getting DTaP  vaccine.  Any child who had a life-threatening allergic reaction after a dose of DTaP should not get another dose.  Any child who suffered a brain or nervous system disease within 7 days after a dose of DTaP should not get another dose.  Talk with your doctor if your child: ? had a seizure or collapsed after a dose of DTaP, ? cried non-stop for 3 hours or more after a dose of DTaP, ? had a fever over 105F after a dose of DTaP. Ask your doctor for more information. Some of these children should not get another dose of pertussis vaccine, but may get a vaccine without pertussis, called DT. 4. Older children and adults DTaP is not licensed for adolescents, adults, or children 85 years of age and older. But older people still need protection. A vaccine called Tdap is similar to DTaP. A single dose of Tdap is recommended for people 11 through 50 years of age. Another vaccine, called Td, protects against tetanus and diphtheria, but not pertussis. It is recommended every 10 years. There are separate Vaccine Information Statements for these vaccines. 5. What are the risks from DTaP vaccine? Getting diphtheria, tetanus, or pertussis disease is much riskier than getting DTaP vaccine. However, a vaccine, like any medicine, is capable of causing serious problems, such as severe allergic reactions. The risk of DTaP vaccine causing serious harm, or death, is extremely small. Mild problems (common)  Fever (up to about 1 child in 4)  Redness or swelling  where the shot was given (up to about 1 child in 4)  Soreness or tenderness where the shot was given (up to about 1 child in 4) These problems occur more often after the 4th and 5th doses of the DTaP series than after earlier doses. Sometimes the 4th or 5th dose of DTaP vaccine is followed by swelling of the entire arm or leg in which the shot was given, lasting 1-7 days (up to about 1 child in 33). Other mild problems include:  Fussiness (up to about 1  child in 3)  Tiredness or poor appetite (up to about 1 child in 10)  Vomiting (up to about 1 child in 13) These problems generally occur 1-3 days after the shot. Moderate problems (uncommon)  Seizure (jerking or staring) (about 1 child out of 14,000)  Non-stop crying, for 3 hours or more (up to about 1 child out of 1,000)  High fever, over 105F (about 1 child out of 16,000) Severe problems (very rare)  Serious allergic reaction (less than 1 out of a million doses)  Several other severe problems have been reported after DTaP vaccine. These include: ? Long-term seizures, coma, or lowered consciousness ? Permanent brain damage. These are so rare it is hard to tell if they are caused by the vaccine. Controlling fever is especially important for children who have had seizures, for any reason. It is also important if another family member has had seizures. You can reduce fever and pain by giving your child an aspirin-free pain reliever when the shot is given, and for the next 24 hours, following the package instructions. 6. What if there is a serious reaction? What should I look for? Look for anything that concerns you, such as signs of a severe allergic reaction, very high fever, or behavior changes. Signs of a severe allergic reaction can include hives, swelling of the face and throat, difficulty breathing, a fast heartbeat, dizziness, and weakness. These would start a few minutes to a few hours after the vaccination. What should I do?  If you think it is a severe allergic reaction or other emergency that can't wait, call 9-1-1 or get the person to the nearest hospital. Otherwise, call your doctor.  Afterward, the reaction should be reported to the Vaccine Adverse Event Reporting System (VAERS). Your doctor might file this report, or you can do it yourself through the VAERS web site at www.vaers.SamedayNews.es, or by calling (907)795-5971. ? VAERS is only for reporting reactions. They do not  give medical advice. 7. The National Vaccine Injury Compensation Program The [employer] Vaccine Injury Compensation Program (VICP) is a federal program that was created to compensate people who may have been injured by certain vaccines. Persons who believe they may have been injured by a vaccine can learn about the program and about filing a claim by calling 548-012-0916 or visiting the [place] website at GoldCloset.com.ee. 8. How can I learn more?  Ask your doctor.  Call your local or state health department.  Contact the Centers for Disease Control and Prevention (CDC): ? Call 2162493163 (1-800-CDC-INFO) or ? Visit CDC's website at http://hunter.com/ CDC DTaP Vaccine (Diphtheria, Tetanus, and Pertussis) VIS (03/11/06) This information is not intended to replace advice given to you by your health care provider. Make sure you discuss any questions you have with your health care provider. Document Released: 08/09/2006 Document Revised: 07/02/2016 Document Reviewed: 07/02/2016 Elsevier Interactive Patient Education  2017 [employer].

## 2018-02-01 ENCOUNTER — Other Ambulatory Visit: Payer: Self-pay

## 2018-02-01 DIAGNOSIS — J3489 Other specified disorders of nose and nasal sinuses: Secondary | ICD-10-CM

## 2018-02-01 MED ORDER — MUPIROCIN CALCIUM 2 % NA OINT: 1.0000 "application " | TOPICAL_OINTMENT | Freq: Two times a day (BID) | NASAL | 0 refills | Status: DC

## 2018-02-03 ENCOUNTER — Encounter: Payer: Self-pay | Admitting: Internal Medicine

## 2018-02-15 ENCOUNTER — Other Ambulatory Visit: Payer: BLUE CROSS/BLUE SHIELD

## 2018-02-16 ENCOUNTER — Encounter: Payer: Self-pay | Admitting: Internal Medicine

## 2018-02-16 DIAGNOSIS — R7303 Prediabetes: Secondary | ICD-10-CM | POA: Insufficient documentation

## 2018-02-17 ENCOUNTER — Other Ambulatory Visit (INDEPENDENT_AMBULATORY_CARE_PROVIDER_SITE_OTHER): Payer: BLUE CROSS/BLUE SHIELD

## 2018-02-17 DIAGNOSIS — Z1159 Encounter for screening for other viral diseases: Secondary | ICD-10-CM

## 2018-02-17 DIAGNOSIS — Z1329 Encounter for screening for other suspected endocrine disorder: Secondary | ICD-10-CM | POA: Diagnosis not present

## 2018-02-17 DIAGNOSIS — E785 Hyperlipidemia, unspecified: Secondary | ICD-10-CM

## 2018-02-17 DIAGNOSIS — R7303 Prediabetes: Secondary | ICD-10-CM | POA: Diagnosis not present

## 2018-02-17 DIAGNOSIS — K760 Fatty (change of) liver, not elsewhere classified: Secondary | ICD-10-CM

## 2018-02-17 DIAGNOSIS — D352 Benign neoplasm of pituitary gland: Secondary | ICD-10-CM | POA: Diagnosis not present

## 2018-02-17 DIAGNOSIS — Z1389 Encounter for screening for other disorder: Secondary | ICD-10-CM

## 2018-02-17 DIAGNOSIS — Z0184 Encounter for antibody response examination: Secondary | ICD-10-CM

## 2018-02-17 DIAGNOSIS — D369 Benign neoplasm, unspecified site: Secondary | ICD-10-CM

## 2018-02-17 LAB — T4, FREE: FREE T4: 0.68 ng/dL (ref 0.60–1.60)

## 2018-02-17 LAB — COMPREHENSIVE METABOLIC PANEL
ALBUMIN: 4 g/dL (ref 3.5–5.2)
ALK PHOS: 57 U/L (ref 39–117)
ALT: 20 U/L (ref 0–53)
AST: 14 U/L (ref 0–37)
BUN: 20 mg/dL (ref 6–23)
CO2: 26 mEq/L (ref 19–32)
CREATININE: 0.82 mg/dL (ref 0.40–1.50)
Calcium: 9.2 mg/dL (ref 8.4–10.5)
Chloride: 106 mEq/L (ref 96–112)
GFR: 105.79 mL/min (ref >60.00)
GLUCOSE: 104 mg/dL — AB (ref 70–99)
Potassium: 4.2 mEq/L (ref 3.5–5.1)
SODIUM: 138 meq/L (ref 135–145)
TOTAL PROTEIN: 6.6 g/dL (ref 6.0–8.3)
Total Bilirubin: 0.5 mg/dL (ref 0.2–1.2)

## 2018-02-17 LAB — LIPID PANEL
CHOL/HDL RATIO: 3
Cholesterol: 158 mg/dL (ref 0–200)
HDL: 54 mg/dL (ref >39.00)
LDL CALC: 79 mg/dL (ref 0–99)
NONHDL: 104.12
TRIGLYCERIDES: 124 mg/dL (ref 0.0–149.0)
VLDL: 24.8 mg/dL (ref 0.0–40.0)

## 2018-02-17 LAB — CBC WITH DIFFERENTIAL/PLATELET
Basophils Absolute: 0 10*3/uL (ref 0.0–0.1)
Basophils Relative: 0.6 % (ref 0.0–3.0)
Eosinophils Absolute: 0.3 10*3/uL (ref 0.0–0.7)
Eosinophils Relative: 3.5 % (ref 0.0–5.0)
HCT: 39.5 % (ref 39.0–52.0)
HEMOGLOBIN: 13.8 g/dL (ref 13.0–17.0)
LYMPHS ABS: 3.2 10*3/uL (ref 0.7–4.0)
Lymphocytes Relative: 44.7 % (ref 12.0–46.0)
MCHC: 35 g/dL (ref 30.0–36.0)
MCV: 89.8 fl (ref 78.0–100.0)
MONO ABS: 0.4 10*3/uL (ref 0.1–1.0)
Monocytes Relative: 5.6 % (ref 3.0–12.0)
NEUTROS PCT: 45.6 % (ref 43.0–77.0)
Neutro Abs: 3.3 10*3/uL (ref 1.4–7.7)
Platelets: 215 10*3/uL (ref 150.0–400.0)
RBC: 4.4 Mil/uL (ref 4.22–5.81)
RDW: 12.8 % (ref 11.5–15.5)
WBC: 7.2 10*3/uL (ref 4.0–10.5)

## 2018-02-17 LAB — URINALYSIS, ROUTINE W REFLEX MICROSCOPIC
BILIRUBIN URINE: NEGATIVE
HGB URINE DIPSTICK: NEGATIVE
Ketones, ur: NEGATIVE
Leukocytes, UA: NEGATIVE
NITRITE: NEGATIVE
PH: 5.5 (ref 5.0–8.0)
RBC / HPF: NONE SEEN (ref 0–?)
Specific Gravity, Urine: >=1.03 — AB (ref 1.000–1.030)
TOTAL PROTEIN, URINE-UPE24: NEGATIVE
Urine Glucose: NEGATIVE
Urobilinogen, UA: 0.2 (ref 0.0–1.0)

## 2018-02-17 LAB — TSH: TSH: 1.64 u[IU]/mL (ref 0.35–4.50)

## 2018-02-17 LAB — FOLLICLE STIMULATING HORMONE: FSH: 2 m[IU]/mL (ref 1.4–18.1)

## 2018-02-17 LAB — LUTEINIZING HORMONE: LH: 1.11 m[IU]/mL — ABNORMAL LOW (ref 1.50–9.30)

## 2018-02-17 LAB — HEMOGLOBIN A1C: Hgb A1c MFr Bld: 5.5 % (ref 4.6–6.5)

## 2018-02-18 LAB — PROLACTIN: Prolactin: 2 ng/mL (ref 2.0–18.0)

## 2018-02-18 LAB — HEPATITIS B SURFACE ANTIBODY, QUANTITATIVE

## 2018-02-18 LAB — TESTOSTERONE TOTAL,FREE,BIO, MALES
ALBUMIN MSPROF: 4 g/dL (ref 3.6–5.1)
Sex Hormone Binding: 22 nmol/L (ref 10–50)
Testosterone: 42 ng/dL — ABNORMAL LOW (ref 250–827)

## 2018-05-06 IMAGING — US US ABDOMEN LIMITED
1 series · 14 of 25 positions shown · non-contrast
Comparison: None.

CLINICAL DATA: Upper abdominal pain and elevated white blood cell
count

EXAM:
US ABDOMEN LIMITED - RIGHT UPPER QUADRANT

[Series 1: us abdomen limited · 0.26mm/px · 14 of 50 slices shown]
[im 1/50]
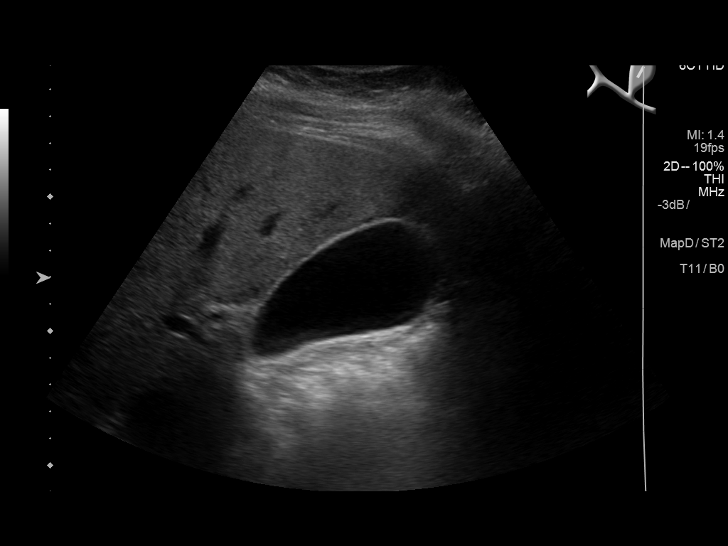
[im 5/50]
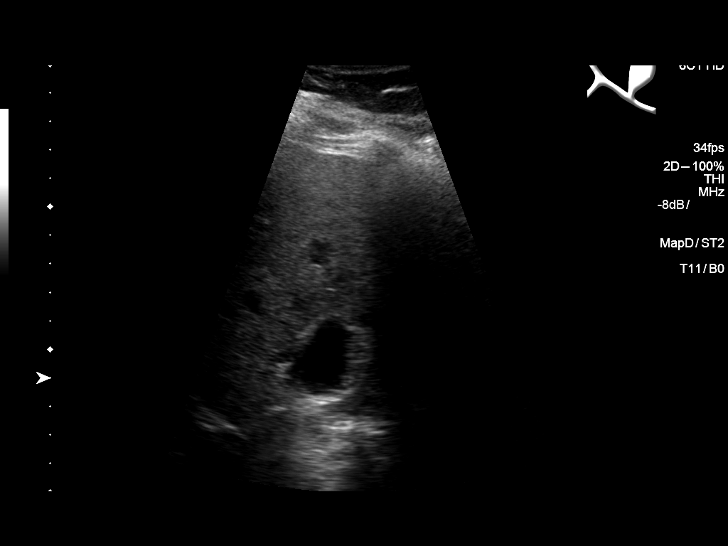
[im 9/50]
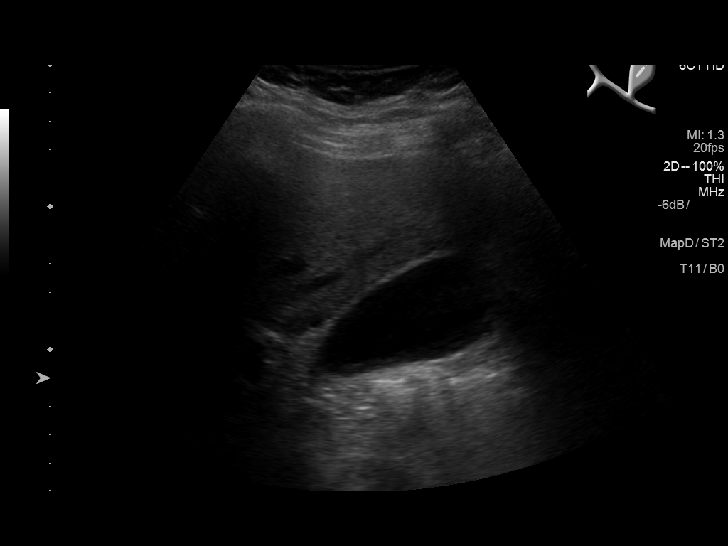
[im 13/50]
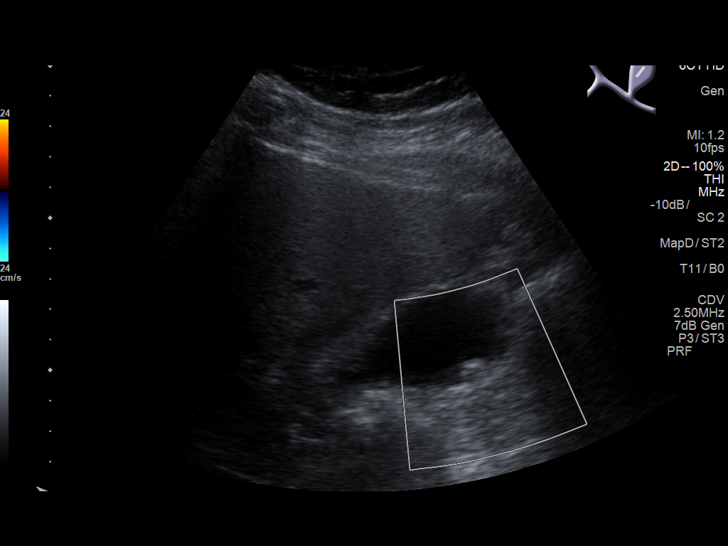
[im 17/50]
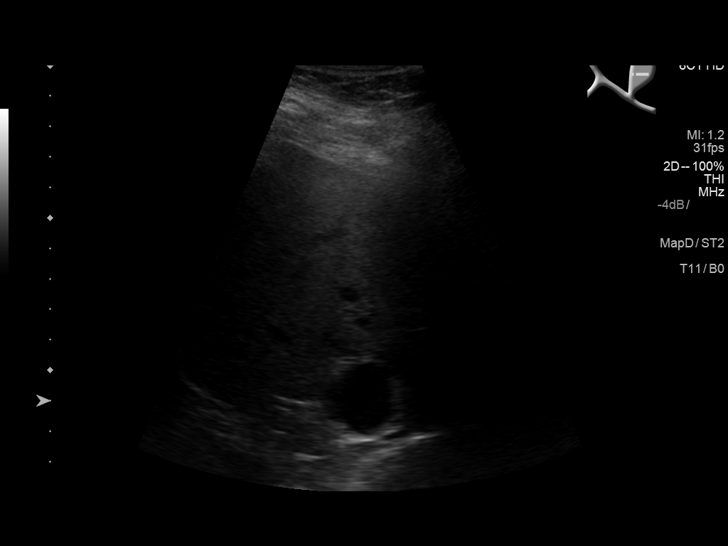
[im 19/50]
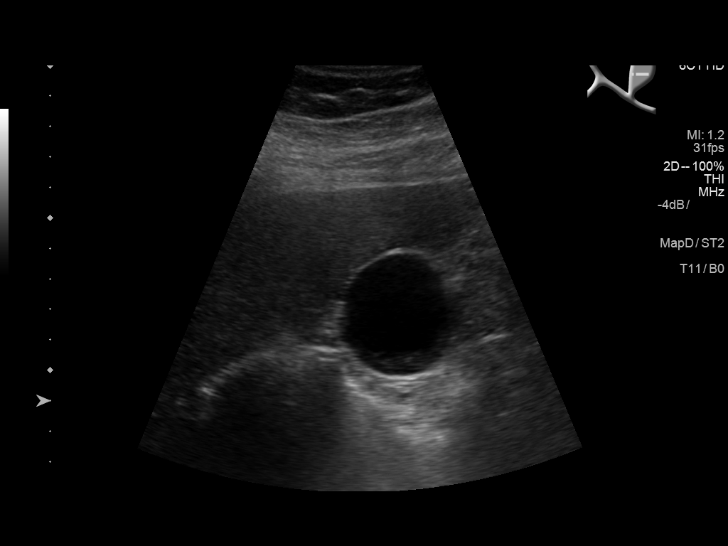
[im 23/50]
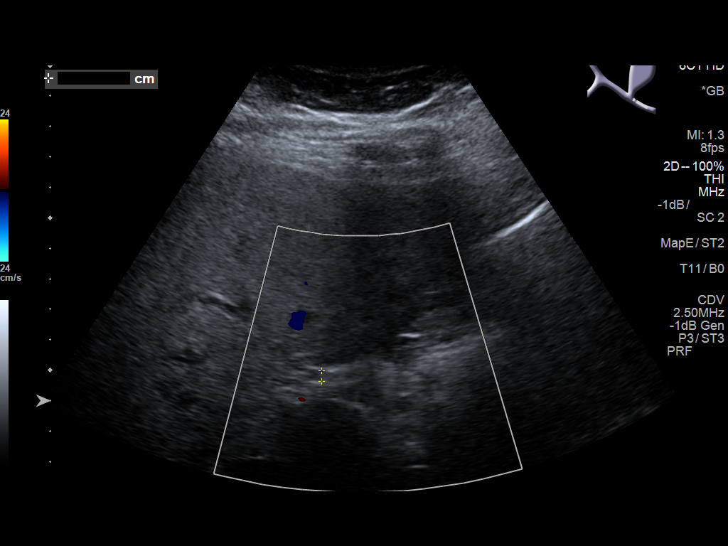
[im 27/50]
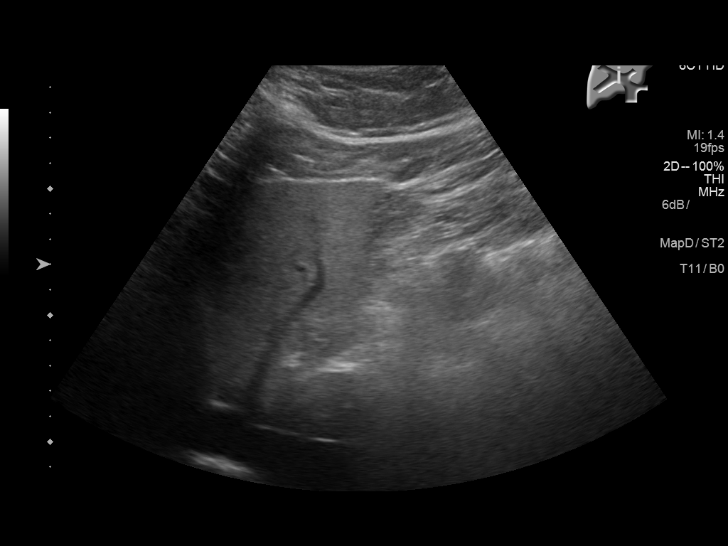
[im 31/50]
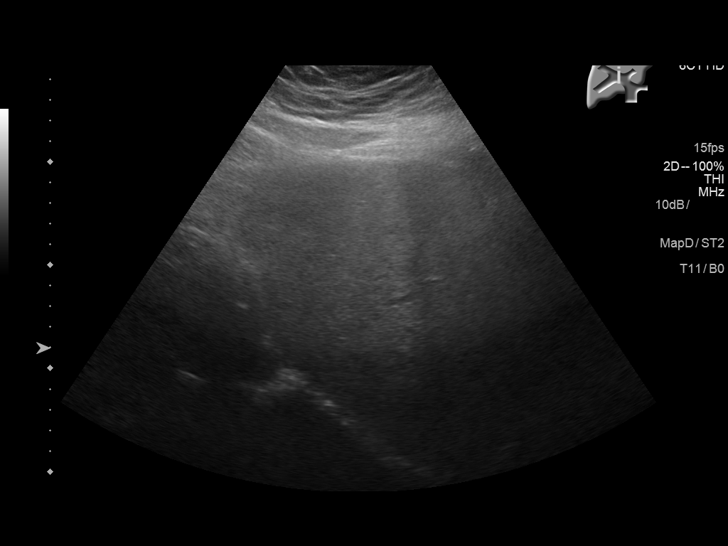
[im 33/50]
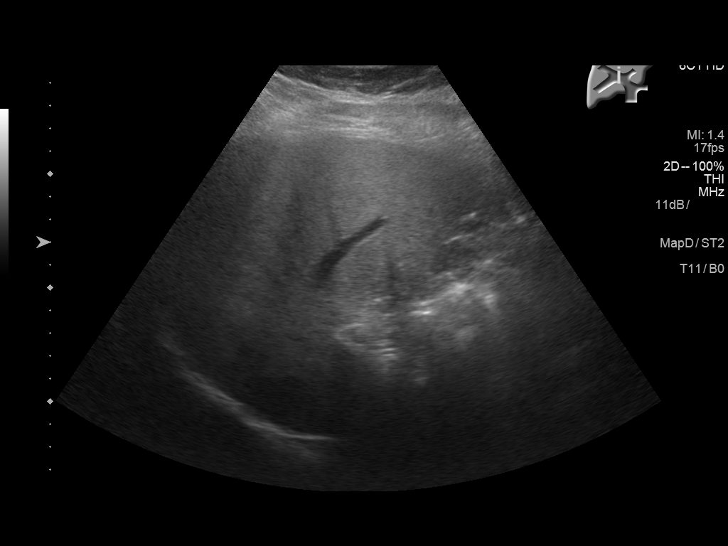
[im 37/50]
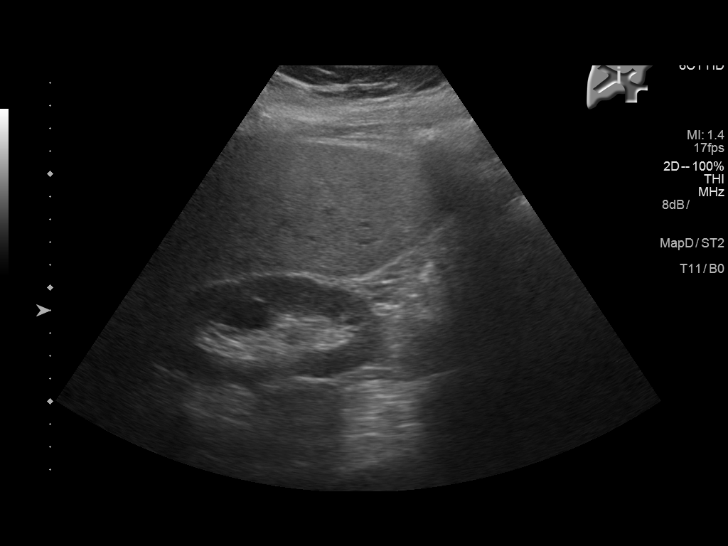
[im 41/50]
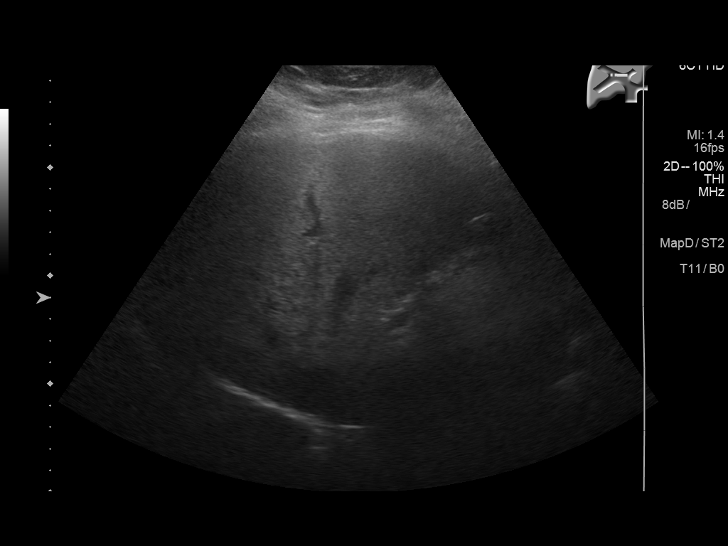
[im 45/50]
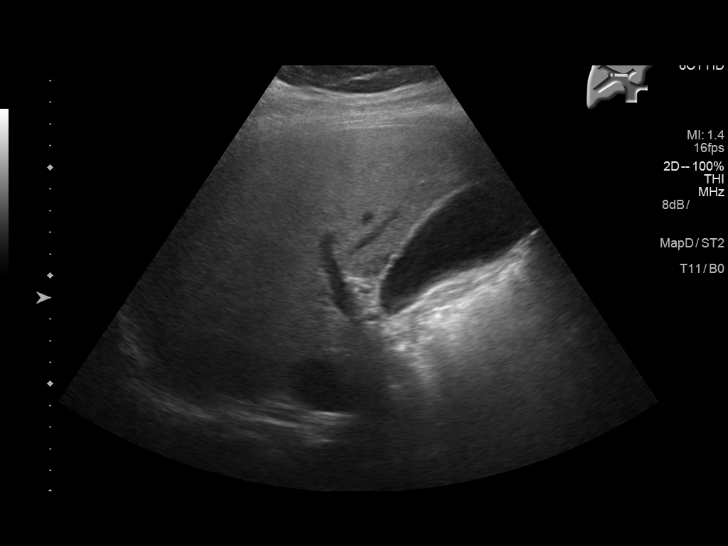
[im 50/50]
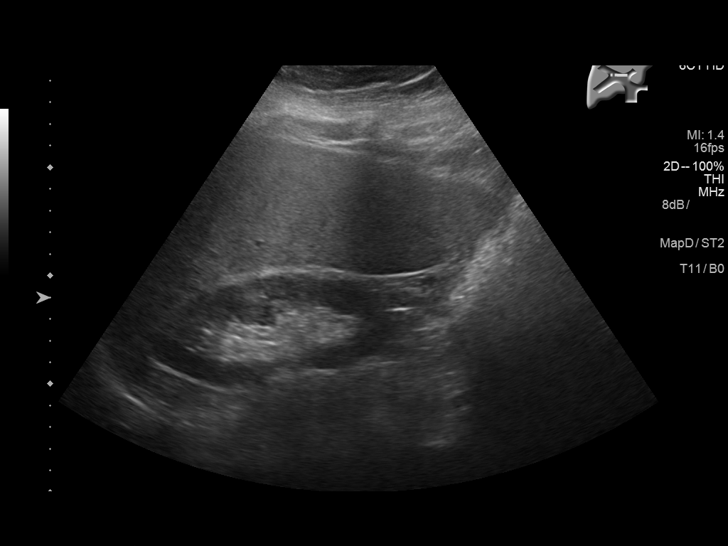

[14 of 25 positions shown; findings below may reference images not displayed]

FINDINGS: Gallbladder:

Within the gallbladder, there is a 9 mm echogenic focus which
neither moves nor shadows, suspicious for a polyp. There are no
echogenic foci in the gallbladder which move and shadow as is
expected with gallstones. There is no gallbladder wall thickening or
pericholecystic fluid.

Common bile duct:

Diameter: 4 mm. No intrahepatic or extrahepatic biliary duct
dilatation.

Liver:

No focal lesion identified. Liver echogenicity is increased
diffusely.

There is a cyst in the mid right kidney measuring 1.8 x 1.8 cm.
IMPRESSION: 9 mm presumed polyp in the gallbladder. This finding warrants a
follow-up ultrasound in 1 year to further assess. Gallbladder
otherwise appears unremarkable.

Increased liver echogenicity, likely due to hepatic steatosis. While
no focal liver lesions are evident, it must be cautioned that the
sensitivity of ultrasound for detection of focal liver lesions is
diminished in this circumstance.

Cyst in right kidney.

## 2018-06-02 ENCOUNTER — Ambulatory Visit: Payer: BLUE CROSS/BLUE SHIELD | Admitting: Internal Medicine

## 2018-06-02 ENCOUNTER — Emergency Department
Admission: EM | Admit: 2018-06-02 | Discharge: 2018-06-02 | Disposition: A | Discharge status: Home / Self Care | Payer: Medicaid Other | Attending: Emergency Medicine | Admitting: Emergency Medicine

## 2018-06-02 ENCOUNTER — Other Ambulatory Visit: Payer: Self-pay

## 2018-06-02 ENCOUNTER — Encounter: Payer: Self-pay | Admitting: Emergency Medicine

## 2018-06-02 DIAGNOSIS — R112 Nausea with vomiting, unspecified: Secondary | ICD-10-CM | POA: Diagnosis not present

## 2018-06-02 DIAGNOSIS — Z79899 Other long term (current) drug therapy: Secondary | ICD-10-CM | POA: Diagnosis not present

## 2018-06-02 DIAGNOSIS — R1010 Upper abdominal pain, unspecified: Secondary | ICD-10-CM

## 2018-06-02 LAB — CBC
HCT: 41.5 % (ref 40.0–52.0)
HEMOGLOBIN: 14.5 g/dL (ref 13.0–18.0)
MCH: 31.1 pg (ref 26.0–34.0)
MCHC: 34.9 g/dL (ref 32.0–36.0)
MCV: 89.1 fL (ref 80.0–100.0)
Platelets: 232 10*3/uL (ref 150–440)
RBC: 4.66 MIL/uL (ref 4.40–5.90)
RDW: 13.3 % (ref 11.5–14.5)
WBC: 6.9 10*3/uL (ref 3.8–10.6)

## 2018-06-02 LAB — TROPONIN I: Troponin I: <0.03 ng/mL (ref <0.03)

## 2018-06-02 LAB — COMPREHENSIVE METABOLIC PANEL
ALK PHOS: 53 U/L (ref 38–126)
ALT: 27 U/L (ref 0–44)
ANION GAP: 8 (ref 5–15)
AST: 26 U/L (ref 15–41)
Albumin: 4.5 g/dL (ref 3.5–5.0)
BUN: 15 mg/dL (ref 6–20)
CALCIUM: 9.2 mg/dL (ref 8.9–10.3)
CO2: 26 mmol/L (ref 22–32)
CREATININE: 0.94 mg/dL (ref 0.61–1.24)
Chloride: 105 mmol/L (ref 98–111)
GFR calc Af Amer: >60 mL/min (ref >60)
GFR calc non Af Amer: >60 mL/min (ref >60)
Glucose, Bld: 162 mg/dL — ABNORMAL HIGH (ref 70–99)
Potassium: 3.7 mmol/L (ref 3.5–5.1)
SODIUM: 139 mmol/L (ref 135–145)
Total Bilirubin: 1 mg/dL (ref 0.3–1.2)
Total Protein: 7.7 g/dL (ref 6.5–8.1)

## 2018-06-02 LAB — LIPASE, BLOOD: Lipase: 31 U/L (ref 11–51)

## 2018-06-02 MED ORDER — ONDANSETRON HCL 4 MG PO TABS: 4.0000 mg | ORAL_TABLET | Freq: Every day | ORAL | 0 refills | Status: DC | PRN

## 2018-06-02 MED ORDER — ONDANSETRON HCL 4 MG/2ML IJ SOLN: 4.0000 mg | Freq: Once | INTRAMUSCULAR | Status: DC

## 2018-06-02 NOTE — ED Provider Notes (Signed)
[facility] Emergency Department Provider Note ____________________________________________   First MD Initiated Contact with Patient 06/02/18 0815     (approximate)  I have reviewed the triage vital signs and the nursing notes.   HISTORY  Chief Complaint Abdominal Pain   HPI Hence Timothy Atkins is a 52 y.o. male with a history of biliary dyskinesia who is presenting to the emergency department upper abdominal pain.  He says that he has vomited 3 times this morning and was having abdominal cramping to the upper abdomen.  However, says that he has no pain at this time.  Is not nauseous.  Says the vomitus was yellow and nonbloody.  Denies any diarrhea.  Denying any chest pain or shortness of breath.  Says this feels similar to his biliary dyskinesia.  Says that he previously had the option to have his gallbladder removed but decide to make dietary changes instead.  He says that he has lost 24 pounds not of any gallbladder issues thereafter.  Past Medical History:  Diagnosis Date  . Arthritis   . Benign cyst of right kidney   . Constipation   . Degenerative cervical disc    mild   . Fatty liver   . Fatty liver   . Glucose intolerance (impaired glucose tolerance) 06/08/2013  . Hyperlipidemia   . 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 Invasive pituitary adenoma ([place]) 08/19/2011  . Male hypogonadism 06/08/2013  . Neck nodule 06/12/2013  . 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 Pituitary tumor   . Prediabetes   . Rosacea   . RUQ pain 03/08/2017   Post RUQ that radiates Ant RUQ. 3 severe episodes over 3 weeks     Patient Active Problem List   Diagnosis Date Noted  . Prediabetes 02/16/2018  . Arthritis 01/31/2018  . Fatty liver 01/31/2018  . Allergic rhinitis 01/31/2018  . 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 Biliary dyskinesia 03/10/2017  . Pain of upper abdomen   . 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 Neck nodule 06/12/2013  . Glucose intolerance (impaired glucose tolerance) 06/08/2013  . Male hypogonadism 06/08/2013  . 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 Invasive pituitary adenoma ([place]) 08/19/2011  . 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 APPENDECTOMY  as child  . TONSILLECTOMY  as child    Prior to Admission medications   Medication Sig Start Date End Date Taking? Authorizing Provider  cabergoline (DOSTINEX) 0.5 MG tablet Take 0.5 mg by mouth 2 (two) times a week.    [provider]  cetirizine (ZYRTEC) 10 MG tablet Take 10 mg by mouth.    [provider]  human chorionic gonadotropin (PREGNYL/NOVAREL) 10000 units injection Inject 3,000 Units into the muscle 3 (three) times a week.     [provider]  mupirocin nasal ointment (BACTROBAN NASAL) 2 % Place 1 application into the nose 2 (two) times daily. Use one-half of tube in each nostril twice daily for five (5) days. After application, press sides of nose together and gently massage. 02/01/18   McLean-Scocuzza, Nino Glow, MD    Allergies Patient has no known  allergies.  Family History  Problem Relation Age of Onset  . Healthy Mother   . Diabetes Mother   . Heart disease Mother   . Hyperlipidemia Mother   . Hypertension Mother   . Healthy Father   . Asthma Father   . COPD Father   . Diabetes Father   . Heart disease Father   . Depression Brother   . Diabetes Brother   . Hyperlipidemia Brother   . Hypertension Brother     Social History Social History   Tobacco Use  . Smoking status: Never  Smoker  . Smokeless tobacco: Never Used  Substance Use Topics  . Alcohol use: No  . Drug use: No    Review of Systems  Constitutional: No fever/chills Eyes: No visual changes. ENT: No sore throat. Cardiovascular: Denies chest pain. Respiratory: Denies shortness of breath. Gastrointestinal:  No diarrhea.  No constipation. Genitourinary: Negative for dysuria. Musculoskeletal: Negative for back pain. Skin: Negative for rash. Neurological: Negative for headaches, focal weakness or numbness.   ____________________________________________   PHYSICAL EXAM:  VITAL SIGNS: ED Triage Vitals [06/02/18 0810]  Enc Vitals Group     BP 129/83     Pulse Rate 70     Resp 18     Temp 98.5 F (36.9 C)     Temp Source Oral     SpO2 97 %     Weight 230 lb (104.3 kg)     Height 5\' 10"  (1.778 m)     Head Circumference      Peak Flow      Pain Score 4     Pain Loc      Pain Edu?      Excl. in [place]?     Constitutional: Alert and oriented. Well appearing and in no acute distress. Eyes: Conjunctivae are normal.  Head: Atraumatic. Nose: No congestion/rhinnorhea. Mouth/Throat: Mucous membranes are moist.  Neck: No stridor.   Cardiovascular: Normal rate, regular rhythm. Grossly normal heart sounds.  Respiratory: Normal respiratory effort.  No retractions. Lungs CTAB. Gastrointestinal: Soft and nontender. No distention.  Negative Murphy sign Musculoskeletal: No lower extremity tenderness nor edema.  No joint effusions. Neurologic:  Normal speech and language. No gross focal neurologic deficits are appreciated. Skin:  Skin is warm, dry and intact. No rash noted. Psychiatric: Mood and affect are normal. Speech and behavior are normal.  ____________________________________________   LABS (all labs ordered are listed, but only abnormal results are displayed)  Labs Reviewed  COMPREHENSIVE METABOLIC PANEL - Abnormal; Notable for the following components:      Result Value   Glucose, Bld  162 (*)    All other components within normal limits  LIPASE, BLOOD  CBC  TROPONIN I  URINALYSIS, COMPLETE (UACMP) WITH MICROSCOPIC   ____________________________________________  EKG  ED ECG REPORT I, Doran Stabler, the attending physician, personally viewed and interpreted this ECG.   Date: 06/02/2018  EKG Time: 0831  Rate: 69  Rhythm: normal sinus rhythm  Axis: Normal  Intervals:none  ST&T Change: No ST segment elevation or depression.  No abnormal T wave inversion.  ____________________________________________  RADIOLOGY   ____________________________________________   PROCEDURES  Procedure(s) performed:   Procedures  Critical Care performed:   ____________________________________________   INITIAL IMPRESSION / ASSESSMENT AND PLAN / ED COURSE  Pertinent labs & imaging results that were available during my care of the patient were reviewed by me and considered in my medical decision making (see chart for details).  Differential diagnosis includes,  but is not limited to, biliary disease (biliary colic, acute cholecystitis, cholangitis, choledocholithiasis, etc), intrathoracic causes for epigastric abdominal pain including ACS, gastritis, duodenitis, pancreatitis, small bowel or large bowel obstruction, abdominal aortic aneurysm, hernia, and ulcer(s). As part of my medical decision making, I reviewed the following data within the electronic MEDICAL RECORD NUMBER Notes from prior ED visits  ----------------------------------------- 9:37 AM on 06/02/2018 -----------------------------------------  Patient is pain-free at this time and tolerated crackers as well as water.  No pain or nausea persistently.  Patient to follow-up with surgery in the office.  To be discharged with Zofran.  He is understanding the diagnosis as well as treatment plan and willing to comply.  Likely continued biliary issues.  However, very reassuring lab work and nontender on exam.  Unlikely  to have inflammatory process such as acute cholecystitis.  I do not believe he needs further imaging at this time.  Well-appearing.  Patient understanding of the plan as well as the diagnosis and willing to comply. ____________________________________________   FINAL CLINICAL IMPRESSION(S) / ED DIAGNOSES  Upper abdominal pain.  Nausea and vomiting.  NEW MEDICATIONS STARTED DURING THIS VISIT:  New Prescriptions   No medications on file     Note:  This document was prepared using Dragon voice recognition software and may include unintentional dictation errors.     Orbie Pyo, MD 06/02/18 [id number]

## 2018-06-02 NOTE — ED Notes (Addendum)
Pt c/o abd pain for the last 3 days - he reports that he has hx of gallbladder issues with recommended surgery last year which he declined - he states this pain does not feel the same - he reports abd bloating with increased amount of flatulence - pt reports that he started vomiting this am (x2) and increased pain in right upper quad - reports shortness of breath (at this time respirations even and unlabored with clear lungs sounds throughout) - pt appears in no acute distress at this time - denies any urinary issues/symptoms - pt also reports that he has started having dizziness this am - denies chest pain

## 2018-06-02 NOTE — ED Triage Notes (Signed)
Pt arrived via POV with reports of abd pain  X 1 week, pt states the pain has worsened over the past 3 days and feels like pressure on abdomen. Pt states he has hx of gallbladder issues last year in May.  Pt states the sxs are similar to previous gallbladder.  Pt states he started vomiting today and is passing gas. Denies any diarrhea.

## 2018-07-11 ENCOUNTER — Encounter: Payer: Self-pay | Admitting: Internal Medicine

## 2018-07-11 ENCOUNTER — Ambulatory Visit (INDEPENDENT_AMBULATORY_CARE_PROVIDER_SITE_OTHER): Payer: Self-pay | Admitting: Internal Medicine

## 2018-07-11 VITALS — BP 126/80 | HR 69 | Temp 98.8°F | Ht 70.0 in | Wt 236.0 lb

## 2018-07-11 DIAGNOSIS — R51 Headache: Secondary | ICD-10-CM

## 2018-07-11 DIAGNOSIS — R519 Headache, unspecified: Secondary | ICD-10-CM

## 2018-07-11 DIAGNOSIS — R531 Weakness: Secondary | ICD-10-CM

## 2018-07-11 DIAGNOSIS — D352 Benign neoplasm of pituitary gland: Secondary | ICD-10-CM

## 2018-07-11 DIAGNOSIS — L299 Pruritus, unspecified: Secondary | ICD-10-CM

## 2018-07-11 DIAGNOSIS — J329 Chronic sinusitis, unspecified: Secondary | ICD-10-CM

## 2018-07-11 DIAGNOSIS — R200 Anesthesia of skin: Secondary | ICD-10-CM

## 2018-07-11 MED ORDER — CETIRIZINE HCL 10 MG PO TABS: 10.0000 mg | ORAL_TABLET | Freq: Every day | ORAL | 3 refills | Status: DC

## 2018-07-11 MED ORDER — FLUTICASONE PROPIONATE 50 MCG/ACT NA SUSP: 2.0000 | Freq: Every day | NASAL | 11 refills | Status: DC

## 2018-07-11 MED ORDER — DOXYCYCLINE HYCLATE 100 MG PO TABS: 100.0000 mg | ORAL_TABLET | Freq: Two times a day (BID) | ORAL | 0 refills | Status: DC

## 2018-07-11 MED ORDER — NEOMYCIN-POLYMYXIN-HC 3.5-10000-1 OT SOLN: 4.0000 [drp] | Freq: Four times a day (QID) | OTIC | 0 refills | Status: DC

## 2018-07-11 NOTE — Progress Notes (Signed)
Pre visit review using our clinic review tool, if applicable. No additional management support is needed unless otherwise documented below in the visit note. 

## 2018-07-11 NOTE — Progress Notes (Signed)
Chief Complaint  Patient presents with  . Follow-up   F/u with wife  1. C/o right sided headache, numbness/weakness arms, legs on right side pain 2/10 has h/a. About 1 month ago had n/v watery yellow billous went to ED 06/02/18 CMET, CBC, troponin negative. He c/o right sided pain from head all the way to leg. He reports due to loss of insurance 12/2017 he has been reducing Cabergoline 0.5 2x per week to 0.25 2x per week or 1 x per week has not seen [facility] endocrine in a while  2. C/o ear pain and itching and allergies and sinus pressure sxs new in the last few days    Review of Systems  Constitutional: Negative for weight loss.  HENT: Positive for ear pain and sinus pain. Negative for hearing loss.   Eyes: Positive for photophobia. Negative for blurred vision.  Respiratory: Negative for shortness of breath.   Cardiovascular: Negative for chest pain.  Gastrointestinal: Negative for nausea and vomiting.  Neurological: Positive for dizziness, weakness and headaches.  Psychiatric/Behavioral: Negative for depression.       +stress    Past Medical History:  Diagnosis Date  . Arthritis   . Benign cyst of right kidney   . Constipation   . Degenerative cervical disc    mild   . Fatty liver   . Fatty liver   . Glucose intolerance (impaired glucose tolerance) 06/08/2013  . Hyperlipidemia   . Hyperprolactinemia ([place]) 08/19/2011   Overview:  05/2004 1363 ng/ml  . 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 Invasive pituitary adenoma ([place]) 08/19/2011  . Male hypogonadism 06/08/2013  . Neck nodule 06/12/2013  . 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 Pituitary tumor   . Prediabetes   . Rosacea   . RUQ pain 03/08/2017   Post RUQ that radiates Ant RUQ. 3 severe episodes over 3 weeks   Past Surgical History:  Procedure Laterality Date  . APPENDECTOMY  as child  . TONSILLECTOMY  as child   Family History  Problem Relation Age of Onset  . Healthy Mother   . Diabetes Mother   . Heart disease Mother   . Hyperlipidemia Mother   . Hypertension Mother   . Healthy Father   . Asthma Father   . COPD Father   . Diabetes Father   . Heart disease Father   . Depression Brother   . Diabetes Brother   . Hyperlipidemia Brother   . Hypertension Brother    Social History   Socioeconomic History  . Marital status: Married    Spouse name: Not on file  . Number of children: Not on file  . Years of education: Not on file  . Highest education level: Not on file  Occupational History  . Not on file  Social Needs  . Financial resource strain: Not on file  . Food insecurity:    Worry: Not on file    Inability: Not on file  . Transportation needs:    Medical: Not on file    Non-medical: Not on file  Tobacco Use  . Smoking status: Never Smoker  . Smokeless tobacco: Never Used  Substance and Sexual Activity  . Alcohol use: No  . Drug use: No  .  Sexual activity: Yes  Lifestyle  . Physical activity:    Days per week: Not on file    Minutes per session: Not on file  . Stress: Not on file  Relationships  . Social connections:    Talks on phone: Not on file    Gets together: Not on file    Attends religious service: Not on file    Active member of club or organization: Not on file    Attends meetings of clubs or organizations: Not on file    Relationship status: Not on file  . Intimate partner violence:    Fear of current or ex partner: Not on file    Emotionally abused: Not on file    Physically abused: Not on file    Forced sexual activity: Not on file  Other Topics Concern  . Not on file  Social History Narrative   Married    1 daughter    Bachelors    Works in IT   No guns    Wears seat  belt, safe in relationship    Current Meds  Medication Sig  . cabergoline (DOSTINEX) 0.5 MG tablet Take 0.5 mg by mouth 2 (two) times a week.  . cetirizine (ZYRTEC) 10 MG tablet Take 1 tablet (10 mg total) by mouth at bedtime.  . human chorionic gonadotropin (PREGNYL/NOVAREL) 10000 units injection Inject 3,000 Units into the muscle 3 (three) times a week.   . mupirocin nasal ointment (BACTROBAN NASAL) 2 % Place 1 application into the nose 2 (two) times daily. Use one-half of tube in each nostril twice daily for five (5) days. After application, press sides of nose together and gently massage.  . ondansetron (ZOFRAN) 4 MG tablet Take 1 tablet (4 mg total) by mouth daily as needed.  . [DISCONTINUED] cetirizine (ZYRTEC) 10 MG tablet Take 10 mg by mouth.   No Known Allergies Recent Results (from the past 2160 hour(s))  Lipase, blood     Status: None   Collection Time: 06/02/18  8:33 AM  Result Value Ref Range   Lipase 31 11 - 51 U/L    Comment: Performed at [facility], [address] [place], [place] [address]  Comprehensive metabolic panel     Status: Abnormal   Collection Time: 06/02/18  8:33 AM  Result Value Ref Range   Sodium 139 135 - 145 mmol/L   Potassium 3.7 3.5 - 5.1 mmol/L   Chloride 105 98 - 111 mmol/L   CO2 26 22 - 32 mmol/L   Glucose, Bld 162 (H) 70 - 99 mg/dL   BUN 15 6 - 20 mg/dL   Creatinine, Ser 0.94 0.61 - 1.24 mg/dL   Calcium 9.2 8.9 - 10.3 mg/dL   Total Protein 7.7 6.5 - 8.1 g/dL   Albumin 4.5 3.5 - 5.0 g/dL   AST 26 15 - 41 U/L   ALT 27 0 - 44 U/L   Alkaline Phosphatase 53 38 - 126 U/L   Total Bilirubin 1.0 0.3 - 1.2 mg/dL   GFR calc non Af Amer >60 >60 mL/min   GFR calc Af Amer >60 >60 mL/min    Comment: (NOTE) The eGFR has been calculated using the CKD EPI equation. This calculation has not been validated in all clinical situations. eGFR's persistently <60 mL/min signify possible Chronic Kidney Disease.    Anion gap 8 5 - 15    Comment:  Performed at [facility], [address] [place], [place] [address]  CBC  Status: None   Collection Time: 06/02/18  8:33 AM  Result Value Ref Range   WBC 6.9 3.8 - 10.6 K/uL   RBC 4.66 4.40 - 5.90 MIL/uL   Hemoglobin 14.5 13.0 - 18.0 g/dL   HCT 41.5 40.0 - 52.0 %   MCV 89.1 80.0 - 100.0 fL   MCH 31.1 26.0 - 34.0 pg   MCHC 34.9 32.0 - 36.0 g/dL   RDW 13.3 11.5 - 14.5 %   Platelets 232 150 - 440 K/uL    Comment: Performed at [facility], [address] [place], [place] [address]  Troponin I     Status: None   Collection Time: 06/02/18  8:33 AM  Result Value Ref Range   Troponin I <0.03 <0.03 ng/mL    Comment: Performed at [facility], [address] [place], [place] [address]   Objective  Body mass index is 33.86 kg/m. Wt Readings from Last 3 Encounters:  07/11/18 236 lb (107 kg)  06/02/18 230 lb (104.3 kg)  01/31/18 236 lb (107 kg)   Temp Readings from Last 3 Encounters:  07/11/18 98.8 F (37.1 C) (Oral)  06/02/18 98.5 F (36.9 C) (Oral)  01/31/18 98.4 F (36.9 C) (Oral)   BP Readings from Last 3 Encounters:  07/11/18 126/80  06/02/18 140/78  01/31/18 116/70   Pulse Readings from Last 3 Encounters:  07/11/18 69  06/02/18 78  01/31/18 69    Physical Exam  Constitutional: He is oriented to person, place, and time. Vital signs are normal. He appears well-developed and well-nourished. He is cooperative.  HENT:  Head: Normocephalic and atraumatic.  Mouth/Throat: Oropharynx is clear and moist and mucous membranes are normal.  Eyes: Pupils are equal, round, and reactive to light. Conjunctivae are normal.  Cardiovascular: Normal rate, regular rhythm and normal heart sounds.  Pulmonary/Chest: Effort normal and breath sounds normal.  Neurological: He is alert and oriented to person, place, and time. Gait normal.  Right arm and leg numbness  Nl strength 5/5 arms and legs    Skin: Skin is warm, dry and intact.  Psychiatric: He has  a normal mood and affect. His speech is normal and behavior is normal. Judgment and thought content normal. Cognition and memory are normal.  Nursing note and vitals reviewed.   Assessment   1. H/a, right sided weakness and numbness I deem could be related to pit. Macroadenoma no MRI imaging since 2011  2. Sinusitis 3. HM Plan   1. MRI w/ and w/o contrast arrange at [facility] try to apply for charity care  Medicaid pending  If worsening go to ED  Transfer meds [facility] to medication management pt to call and transfer carbergoline 0.5 2x per week Rx from [facility] endocrine Call [facility] endocrine apply charity care and call to try to get f/u  2. Doxycycline bid x 10 days  Flonase, zyrtec  neomycine-hc drops to ears b/l  3.  Never had flu shot  Tdap utd  Per old notes needs hep B vaccine  Will need psa and colonoscopy in future   Congratulated wt loss and needs to cont.   Reviewed records [facility]  CXR 01/15/17 neg  Right knee Xray 01/15/17 mild medial femortibial OA small knee effusion  Reviewed labs 01/19/17:  UA cloudy neg blood Total cholesterol 218, TG 124, HDL 47, LDL 146,  A1C 5.7 prediabetse  Hep B sAb qual neg, hep B sAg neg  10/15/16 testosterone low 68 total, free test low 10.6 SHBG  19 wnl, prolactin 1.6 low, TSH nl, T4 nl, cortisol am 6.3 normal   Follows with [facility]   Provider: Dr. Olivia Mackie McLean-Scocuzza-Internal Medicine

## 2018-07-11 NOTE — Patient Instructions (Addendum)
F/u in 1-2 months  Please call [facility] and get them to transfer you prescriptions to medication management pharmacy  [place]  [phone]    If symptoms no better please go to the ER for MRI brain with and w/o contrast   [facility] Ask about [facility] and call [facility] endocrine below   [facility]  [address], [place] [address]  Phone: [phone]  Fax: [phone]   Pituitary Tumors Pituitary tumors are abnormal growths found in the pituitary gland. The pituitary gland is a small organ-about the size of a dime-located in the center of the brain. It makes hormones that affect growth and the functions of other glands in the body. Most pituitary tumors are benign. This means they are noncancerous. They grow slowly and do not spread to other parts of the body. A pituitary tumor may make the pituitary gland produce too many hormones. Tumors that make hormones are called functioning tumors (those that do not make hormones are called nonfunctioning tumors). Problems that can be caused by pituitary tumors include:  Cushing disease. This disease causes fat to build up in the face, back, and chest while the arms and legs become thin.  Acromegaly. This is a condition in which the hands, feet, and face are larger than normal.  Breast milk production even though there is no pregnancy.  What are the causes? The cause of most pituitary tumors is not known. In some cases, these kinds of tumors run in a family. What increases the risk? Some cases of pituitary tumors are due to genetic factors that a person inherits that increase the likelihood of developing certain tumors, including pituitary tumors. What are the signs or symptoms?  Headaches.  Vision problems.  Weakness or low energy.  Clear fluid draining from the nose.  Changes in the sense of smell.  Feeling sick to your stomach  (nauseous) and vomiting.  Problems caused by the production of too many hormones, such as: ? Infertility. ? Loss of menstrual periods in women. ? Abnormal growth. ? High blood pressure (hypertension). ? Heat or cold intolerance. ? Other skin and body changes. ? Nipple discharge. ? Decreased sexual function. How is this diagnosed? If you develop symptoms, you will be sent for a CT scan or MRI to look for pituitary tumors. If you know that these kinds of tumors run in your family, you may need to have your blood tested regularly to monitor pituitary hormone levels. How is this treated? These tumors are best treated when they are found and diagnosed early. Treatments include:  Surgical removal of the tumor. This is the most common treatment.  Radiation therapy. During this treatment, high doses of X-rays are used to kill tumor cells.  Drug therapy. This involves using certain medicines to block the pituitary gland from producing too many hormones.  Follow these instructions at home:  Drink plenty of fluids.  Measure your urine output if directed to do so by your health care provider.  Do not pick your nose or remove any crusting.  Do not do any activities that require straining.  Take all medicines as directed by your health care provider.  Keep follow-up appointments as directed by your health care provider. Contact a health care provider if:  You have sudden, unusual thirst.  You are urinating frequently.  You have a headache that will not go away.  You have new vision  changes.  You notice clear fluid leaking from your nose or ears, a sensation of fluid trickling down the back of your throat, or a salty taste in your mouth.  You are having trouble concentrating. Get help right away if:  Your symptoms suddenly become severe.  You have a nosebleed that does not stop after a few minutes.  You have a fever over 101F (38.3C).  You have a severe headache or a stiff  neck.  You are confused or not as alert as usual.  You have chest pain or shortness of breath. This information is not intended to replace advice given to you by your health care provider. Make sure you discuss any questions you have with your health care provider. Document Released: 10/02/2002 Document Revised: 03/19/2016 Document Reviewed: 04/14/2013 Elsevier Interactive Patient Education  [employer].

## 2018-07-20 ENCOUNTER — Telehealth: Payer: Self-pay | Admitting: Internal Medicine

## 2018-07-20 ENCOUNTER — Other Ambulatory Visit: Payer: Self-pay | Admitting: Internal Medicine

## 2018-07-20 ENCOUNTER — Telehealth: Payer: Self-pay

## 2018-07-20 DIAGNOSIS — D352 Benign neoplasm of pituitary gland: Secondary | ICD-10-CM

## 2018-07-20 DIAGNOSIS — R9089 Other abnormal findings on diagnostic imaging of central nervous system: Secondary | ICD-10-CM

## 2018-07-20 NOTE — Telephone Encounter (Signed)
Patient was informed of results.  Patient understood and no questions, comments, or concerns at this time.  

## 2018-07-20 NOTE — Telephone Encounter (Signed)
Is there a way you can print off report so I can mail to patient?

## 2018-07-20 NOTE — Telephone Encounter (Signed)
Awaiting paperwork for PA to be placed.

## 2018-07-20 NOTE — Telephone Encounter (Signed)
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

## 2018-07-20 NOTE — Telephone Encounter (Signed)
Patient has been informed.

## 2018-07-20 NOTE — Telephone Encounter (Signed)
Copied from [place] [id number]. Topic: General - Other >> Jul 19, 2018  5:03 PM Yvette Rack wrote: Reason for CRM: Pt states a prior authorization is needed for the human chorionic gonadotropin (PREGNYL/NOVAREL) 10000 units injection. Pt request call back. Cb# [id number]

## 2018-07-20 NOTE — Telephone Encounter (Signed)
Pt needs to call [facility] endocrine to try to get Pregnl medication as well   [place]

## 2018-11-29 ENCOUNTER — Encounter: Payer: Self-pay | Admitting: Internal Medicine

## 2019-03-13 ENCOUNTER — Ambulatory Visit: Payer: Medicaid Other | Admitting: Physical Therapy

## 2019-03-14 ENCOUNTER — Other Ambulatory Visit: Payer: Self-pay

## 2019-03-14 ENCOUNTER — Ambulatory Visit: Payer: BC Managed Care – PPO | Attending: Internal Medicine | Admitting: Physical Therapy

## 2019-03-14 ENCOUNTER — Encounter: Payer: Self-pay | Admitting: Physical Therapy

## 2019-03-14 DIAGNOSIS — R262 Difficulty in walking, not elsewhere classified: Secondary | ICD-10-CM | POA: Diagnosis present

## 2019-03-14 DIAGNOSIS — M25561 Pain in right knee: Secondary | ICD-10-CM | POA: Insufficient documentation

## 2019-03-14 DIAGNOSIS — M25661 Stiffness of right knee, not elsewhere classified: Secondary | ICD-10-CM | POA: Diagnosis present
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
## 2019-03-17 DIAGNOSIS — M25569 Pain in unspecified knee: Secondary | ICD-10-CM | POA: Insufficient documentation

## 2019-03-17 DIAGNOSIS — E785 Hyperlipidemia, unspecified: Secondary | ICD-10-CM | POA: Insufficient documentation

## 2019-03-17 DIAGNOSIS — K219 Gastro-esophageal reflux disease without esophagitis: Secondary | ICD-10-CM | POA: Insufficient documentation

## 2019-03-21 ENCOUNTER — Ambulatory Visit: Payer: BC Managed Care – PPO | Admitting: Physical Therapy

## 2019-03-23 ENCOUNTER — Ambulatory Visit: Payer: BC Managed Care – PPO | Admitting: Physical Therapy

## 2019-03-23 ENCOUNTER — Telehealth: Payer: Self-pay | Admitting: Physical Therapy

## 2019-03-23 NOTE — Telephone Encounter (Signed)
Called patient after he did not show up to his appointment today at 11:30am. This is the second no-show in a row. Patient answered and stated he needed to change his appointments due to unexpected work conflicts. He stated he was about to call us but could not find the number. Patient states the earliest he can attend each day is 3:30 or 4:00pm. Transferred patient to scheduling team so they can work with him to re-schedule appointments to a more convenient time.   Everlean Alstrom. Graylon Good, PT, DPT 03/23/19, 11:53 AM

## 2019-03-27 ENCOUNTER — Ambulatory Visit: Payer: BC Managed Care – PPO | Admitting: Physical Therapy

## 2019-03-28 ENCOUNTER — Ambulatory Visit: Payer: BC Managed Care – PPO | Attending: Internal Medicine | Admitting: Physical Therapy

## 2019-03-28 ENCOUNTER — Encounter: Payer: Self-pay | Admitting: Physical Therapy

## 2019-03-28 ENCOUNTER — Other Ambulatory Visit: Payer: Self-pay

## 2019-03-28 DIAGNOSIS — M25561 Pain in right knee: Secondary | ICD-10-CM | POA: Diagnosis not present

## 2019-03-28 DIAGNOSIS — M25661 Stiffness of right knee, not elsewhere classified: Secondary | ICD-10-CM | POA: Insufficient documentation

## 2019-03-28 DIAGNOSIS — R262 Difficulty in walking, not elsewhere classified: Secondary | ICD-10-CM

## 2019-03-28 NOTE — Therapy (Signed)
[facility] [address] [place], [address] Phone: [phone]   Fax:  [phone]  Physical Therapy Treatment  Patient Details  Name: Timothy Atkins MRN: [id number] Date of Birth: 28-Aug-1966 Referring Provider (PT): Perrin Maltese MD   Encounter Date: 03/28/2019  PT End of Session - 03/28/19 1631    Visit Number  2    Number of Visits  16    Date for PT Re-Evaluation  05/09/19    Authorization Type  Medicaid reporting period from 03/14/2019    Authorization Time Period  Current cert period: 02/26/5464 - 05/09/2019    Authorization - Visit Number  1    Authorization - Number of Visits  3    PT Start Time  1630    PT Stop Time  1715    PT Time Calculation (min)  45 min    Activity Tolerance  Patient tolerated treatment well    Behavior During Therapy  [facility] for tasks assessed/performed       Past Medical History:  Diagnosis Date  . Arthritis   . Benign cyst of right kidney   . Constipation   . Degenerative cervical disc    mild   . Fatty liver   . Fatty liver   . Glucose intolerance (impaired glucose tolerance) 06/08/2013  . Hyperlipidemia   . Hyperprolactinemia ([place]) 08/19/2011   Overview:  05/2004 1363 ng/ml  . 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 Invasive pituitary adenoma ([place]) 08/19/2011  . Male hypogonadism 06/08/2013  . Neck nodule 06/12/2013  . 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 Pituitary tumor   . Prediabetes   . Rosacea   . RUQ pain 03/08/2017   Post RUQ that radiates Ant RUQ. 3 severe episodes over 3 weeks    Past Surgical History:  Procedure Laterality Date  . APPENDECTOMY  as child  .  TONSILLECTOMY  as child    There were no vitals filed for this visit.  Subjective Assessment - 03/28/19 1634    Subjective  Patient reports he saw the Chiropractor last Wedenesday who did an adjustment on his lower and upper back which felt good at the time, but hurt a lot the next day in the low back and down the R leg. He had one chiropractic visit the day after his last PT session and it felt really good, but when the same procedures were applied last week he felt good during the adjustment but painful after. He sees Vernelle Emerald, [place] nearby. He plans to see him every week for a couple of weeks. pt has an appointment tomorrow. Chiropractor gave him HEP, corner pec stretch for upper back 3 second holds.      Pertinent History  Patient is a 53 y.o. male who presents to outpatient physical therapy with a referral for medical diagnosis R knee pain. This patient's chief complaints consist of posterior and knee and hamstring pain, knee stiffness and interference with usual activities, leading to the following functional deficits: difficulty walking, bending, kneeling, being active, sitting following walking. Relevant past medical history and comorbidities include pituitary macroadenoma (currently being successfully treated with medication, benign), hypogonadotropic hypogonadism syndrome, partial hypopituitarism, fatty liver, benign cyst of  R kidney, pre-diabetes, appendectomy, biliary dyskinesia, allergic rhinitis, sensitivity to latex.    Limitations  Sitting;Walking;Lifting;House hold activities;Other (comment)   usual activities around the house are harder or limited including getting up and down from the floor, anything that requires use of R LE   Diagnostic tests  Radiograph from 01/15/2017: FINDINGS:  Mild medial femorotibial narrowing with tibial spine spurring. Small superior patellar osteophyte. No acute fracture or dislocation. Small knee effusion. No soft tissue swelling.    Patient Stated Goals  to  get better: 80-90% would be good    Currently in Pain?  No/denies    Pain Onset  More than a month ago         TREATMENT:  **CONFIRMS SENSITIVITY TO LATEX** Denies history of spinal surgery Denies long term use of steroid medication.   Therapeutic exercise:to centralize symptoms and improve ROM, strength, muscular endurance, and activity tolerance required for successful completion of functional activities.  - Treadmill 2.2 mph with no grade and no UE support. For improved lower extremity mobility, muscular endurance, and weightbearing activity tolerance; and to induce the analgesic effect of aerobic exercise, stimulate improved joint nutrition, and prepare body structures and systems for following interventions. x 5 min during subjective exam.  - standing lumbar extensions over counter 2x10 with check of pain with eccentric R hamstring contraction, no change.  - Prone press up x 10 during = no effect, with clinician overpressure x 10 = no effect. Continues to be painful to eccentric hamstring contraction favoring medial hamstrings. - Squatting drill focusing on hip hinge and neutral spine touching hands to knees x 10. Required visual and tactile cuing. - deadlifts pulling bar on cable machine from mid shin progressing from 10# to 25# for a total of about 30 reps focusing on improving hip hinge and form. Patient demo significant improvement with practice.  - deadlifts with 20# kettlebell x 8 reps to translate bar technique to technique that can be adapted with weighted pillow case at home.  - supine eccentric hamstring curls with sliders x 20 with sliders - attempted single leg but caused back pain so discontinued, x5 reps.  - Education on diagnosis, prognosis, POC, anatomy and physiology of current condition.  - Education on HEP including handout   Manual therapy: to reduce pain and tissue tension, improve range of motion, neuromodulation, in order to promote improved ability to complete  functional activities. - STM to right medial hamstrings to decrease tension  HOME EXERCISE PROGRAM Access Code: [email or web address]  URL: [email or web address]  Date: 03/14/2019  Prepared by: Rosita Kea   Exercises   Seated Correct Posture   Seated Quad Set - 10 reps - 5 hold - 4x daily   Prone Press Up - 10-15 reps - 1 second hold - 4-6x daily   .   PT Education - 03/28/19 1631    Education Details  Exercise purpose/form. Self management techniques. Education on diagnosis, prognosis, POC, anatomy and physiology of current condition     Person(s) Educated  Patient    Methods  Explanation;Demonstration;Tactile cues;Verbal cues    Comprehension  Verbalized understanding;Returned demonstration;Verbal cues required;Tactile cues required       PT Short Term Goals - 03/14/19 1724      PT SHORT TERM GOAL #1   Title  Be independent with initial home exercise program for self-management of symptoms.    Baseline  Initial HEP provided at IE (03/14/2019);     Time  2    Period  Weeks  Status  New    Target Date  03/28/19        PT Long Term Goals - 03/14/19 1724      PT LONG TERM GOAL #1   Title  Be independent with a long-term home exercise program for self-management of symptoms.     Baseline  Initial HEP provided at IE (03/14/2019);     Time  8    Period  Weeks    Status  New    Target Date  05/09/19      PT LONG TERM GOAL #2   Title  Demonstrate improved FOTO score by 10 units to demonstrate improvement in overall condition and self-reported functional ability.     Baseline  FOTO = 26 (03/14/2019);     Time  8    Period  Weeks    Status  New    Target Date  05/09/19      PT LONG TERM GOAL #3   Title  Patient will demonstrate 0 degrees AROM knee extension without pain to improve function for weight bearing activities.     Baseline  lacking 5 degrees painful (03/14/2019);     Time  8    Period  Weeks    Status  New    Target Date  05/09/19      PT LONG TERM  GOAL #4   Title  Patient will demonstrate R knee flexion strength in prone at least 4+/5 with no pain with tibia internally rotated  to improve patient's ability to complete functional activities such as stepping, swing phase of gait.     Baseline  painful 4/5 (03/14/2019);     Time  8    Period  Weeks    Status  New    Target Date  05/09/19      PT LONG TERM GOAL #5   Title  Complete community, work and/or recreational activities without limitation due to current condition.     Baseline  difficulty walking, bending, kneeling, being active, sitting following walking, getting up and down from floor, activities requiring use of R LE (03/14/2019);     Time  8    Period  Weeks    Status  New    Target Date  05/09/19            Plan - 03/28/19 1851    Clinical Impression Statement  Pt tolerated evaluation and treatment well. Pt was able to complete all exercises with minimal to no lasting increase in pain or discomfort. He demonstrated great improvements in form and ability to perform proper hip hinge. Leg symptoms do not appear to originating from back at this point. Suspect residual hamstring tendinopathy that requires eccentric strengthening to help re-align and strengthen tissue and improve tolerance to load and decreased pain. Eccentric hamstring exercises were introduced today with good results. Educated about potential DOMS and appropriate response. Pt required multimodal cuing for proper technique and to facilitate improved neuromuscular control, strength, range of motion, and functional ability resulting in improved performance and form. Patient would benefit from continued physical therapy to address remaining impairments and functional limitations to work towards stated goals and return to PLOF or maximal functional independence    Personal Factors and Comorbidities  Comorbidity 3+    Comorbidities   pituitary macroadenoma (currently being successfully treated with medication, benign),  hypogonadotropic hypogonadism syndrome, partial hypopituitarism, fatty liver, benign cyst of R kidney, pre-diabetes, appendectomy, biliary dyskinesia, allergic rhinitis, sensitivity to latex, obesity.  Examination-Activity Limitations  Squat;Stand;Sit;Carry;Lift;Locomotion Level;Other;Bend   general activity, getting up and down from floor, getting up and down from sitting, general multiplane motions involving R LE.    Examination-Participation Restrictions  Yard Work;Other;Community Activity;Interpersonal Relationship   participating in usual social and home activities   Stability/Clinical Decision Making  Evolving/Moderate complexity   unclear eitiology; not improving as expected   Rehab Potential  Good    PT Frequency  2x / week    PT Duration  8 weeks    PT Treatment/Interventions  ADLs/Self Care Home Management;Cryotherapy;Moist Heat;Gait training;Functional mobility training;Therapeutic activities;Therapeutic exercise;Balance training;Neuromuscular re-education;Patient/family education;Manual techniques;Passive range of motion;Dry needling;Taping;Spinal Manipulations;Joint Manipulations   joint mobilizations grades I-IV   PT Next Visit Plan  assess response to HEP and update as appropriate; consider starting eccentric hamstring activities and further assess functional activities.     PT Home Exercise Plan  Medbridge Access Code: [email or web address]     Consulted and Agree with Plan of Care  Patient       Patient will benefit from skilled therapeutic intervention in order to improve the following deficits and impairments:  Abnormal gait, Decreased activity tolerance, Decreased endurance, Decreased range of motion, Decreased strength, Impaired perceived functional ability, Pain, Obesity, Increased muscle spasms, Difficulty walking, Decreased mobility  Visit Diagnosis: Right knee pain, unspecified chronicity  Difficulty in walking, not elsewhere classified  Stiffness of right knee, not elsewhere  classified     Problem List Patient Active Problem List   Diagnosis Date Noted  . Sinusitis 07/11/2018  . Prediabetes 02/16/2018  . Arthritis 01/31/2018  . Fatty liver 01/31/2018  . Allergic rhinitis 01/31/2018  . Pituitary macroadenoma ([place]) 01/31/2018  . Biliary dyskinesia 03/10/2017  . Pain of upper abdomen   . 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 Neck nodule 06/12/2013  . Glucose intolerance (impaired glucose tolerance) 06/08/2013  . Male hypogonadism 06/08/2013  . Hyperprolactinemia ([place]) 08/19/2011  . Invasive pituitary adenoma ([place]) 08/19/2011  . 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 Graylon Good, PT, DPT 03/28/19, 6:54 PM  [facility] [address], [place], [address] Phone: [phone]   Fax:  [phone]  Name: Timothy Atkins MRN: [id number] Date of Birth: 07-07-1966

## 2019-03-29 ENCOUNTER — Encounter: Payer: Self-pay | Admitting: Physical Therapy

## 2019-03-29 ENCOUNTER — Other Ambulatory Visit: Payer: Self-pay

## 2019-03-29 ENCOUNTER — Ambulatory Visit: Payer: BC Managed Care – PPO | Admitting: Physical Therapy

## 2019-03-29 DIAGNOSIS — M25561 Pain in right knee: Secondary | ICD-10-CM

## 2019-03-29 DIAGNOSIS — R262 Difficulty in walking, not elsewhere classified: Secondary | ICD-10-CM

## 2019-03-29 DIAGNOSIS — M25661 Stiffness of right knee, not elsewhere classified: Secondary | ICD-10-CM

## 2019-03-29 NOTE — Therapy (Signed)
[facility] [address] [place], [address] Phone: [phone]   Fax:  [phone]  Physical Therapy Treatment  Patient Details  Name: Timothy Atkins MRN: [id number] Date of Birth: 1966/03/31 Referring Provider (PT): Perrin Maltese MD   Encounter Date: 03/29/2019  PT End of Session - 03/29/19 1637    Visit Number  3    Number of Visits  16    Date for PT Re-Evaluation  05/09/19    Authorization Type  Medicaid reporting period from 03/14/2019    Authorization Time Period  Current cert period: 04/13/5092 - 05/09/2019    Authorization - Visit Number  3    Authorization - Number of Visits  3    PT Start Time  1630    PT Stop Time  1715    PT Time Calculation (min)  45 min    Activity Tolerance  Patient tolerated treatment well    Behavior During Therapy  [facility] for tasks assessed/performed       Past Medical History:  Diagnosis Date  . Arthritis   . Benign cyst of right kidney   . Constipation   . Degenerative cervical disc    mild   . Fatty liver   . Fatty liver   . Glucose intolerance (impaired glucose tolerance) 06/08/2013  . Hyperlipidemia   . Hyperprolactinemia ([place]) 08/19/2011   Overview:  05/2004 1363 ng/ml  . 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 Invasive pituitary adenoma ([place]) 08/19/2011  . Male hypogonadism 06/08/2013  . Neck nodule 06/12/2013  . 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 Pituitary tumor   . Prediabetes   . Rosacea   . RUQ pain 03/08/2017   Post RUQ that radiates Ant RUQ. 3 severe episodes over 3 weeks    Past Surgical History:  Procedure Laterality Date  . APPENDECTOMY  as child  .  TONSILLECTOMY  as child    There were no vitals filed for this visit.  Subjective Assessment - 03/29/19 1635    Subjective  Patient reports he is feeling well. He has a bit of discomfort in his upper back and had a previously scheduled chiropractic appointment today where he received an adjustement to that region. He states he was a little sore in his low back this morning when he woke up but the soreness dissapated as the day went on. He states it is tolerable soreness. He was just here yesterday and did not try and HEP since his last visit. He has been very busy at work with [employer]. He reports 2/10 pain in the R medial hamstring region.     Pertinent History  Patient is a 53 y.o. male who presents to outpatient physical therapy with a referral for medical diagnosis R knee pain. This patient's chief complaints consist of posterior and knee and hamstring pain, knee stiffness and interference with usual activities, leading to the following functional deficits: difficulty walking, bending, kneeling, being active, sitting following walking. Relevant past medical history and comorbidities include pituitary macroadenoma (currently being successfully treated with medication, benign), hypogonadotropic hypogonadism syndrome, partial hypopituitarism, fatty liver, benign cyst of R kidney, pre-diabetes, appendectomy, biliary dyskinesia, allergic rhinitis, sensitivity to latex.  Limitations  Sitting;Walking;Lifting;House hold activities;Other (comment)   usual activities around the house are harder or limited including getting up and down from the floor, anything that requires use of R LE   Diagnostic tests  Radiograph from 01/15/2017: FINDINGS:  Mild medial femorotibial narrowing with tibial spine spurring. Small superior patellar osteophyte. No acute fracture or dislocation. Small knee effusion. No soft tissue swelling.    Patient Stated Goals  to get better: 80-90% would be good    Currently in Pain?   Yes    Pain Score  2     Pain Location  Leg    Pain Orientation  Right;Posterior    Pain Onset  More than a month ago         TREATMENT: **CONFIRMS SENSITIVITY TO LATEX** Denies history of spinal surgery Denies long term use of steroid medication.  Therapeutic exercise:to centralize symptoms and improve ROM, strength, muscular endurance, and activity tolerance required for successful completion of functional activities. - Treadmill 2.2 mph with no grade and no UE support. For improved lower extremity mobility, muscular endurance, and weightbearing activity tolerance; and to induce the analgesic effect of aerobic exercise, stimulate improved joint nutrition, and prepare body structures and systems for following interventions. x 5 min during subjective exam.  - hip hinge drill using stick over back focusing on neutral spine x20 Required visual and tactile cuing - deadlifts pulling bar on cable machine from mid shin progressing from 10# x5 t warm up and reinforce technique.  - kickstand deadlifs x 10 each side with cable machine bar at 10#  focusing on improving hip hinge and form to increase hamstring and glute load without increasing load on lumbar region. Patient demo significant improvement in form with practice.  - kickstand deadlifts with 10# dumbbell x 10 each side to translate bar technique to technique that can be adapted with weighted pillow case at home.  - SL Benin deadlift 10# dumbbell to 9 inch stool with light support with contralateral UE x10 each side, to futher isolate eccentric hamstring flexion.  - supine eccentric hamstring curls with sliders 3x10 with sliders - total gym R leg eccentric extension with guarding and assistance from clinician and L leg support. X 10. Patient states he feels sore region working, but no worse following. Total gym in lowest position. Clinician holding hip in slight IR to target medial hamstrings.  -Education on diagnosis, prognosis, POC,  anatomy and physiology of current condition.  HOME EXERCISE PROGRAM Access Code: [email or web address]  URL: [email or web address]  Date: 03/14/2019  Prepared by: Rosita Kea   Exercises   Seated Correct Posture   Seated Quad Set - 10 reps - 5 hold - 4x daily   Prone Press Up - 10-15 reps - 1 second hold - 4-6x daily   PT Education - 03/29/19 1637    Education Details  Exercise purpose/form. Self management techniques. Education on diagnosis, prognosis, POC, anatomy and physiology of current condition     Person(s) Educated  Patient    Methods  Explanation;Demonstration;Tactile cues;Verbal cues    Comprehension  Verbalized understanding;Returned demonstration;Verbal cues required;Tactile cues required       PT Short Term Goals - 03/14/19 1724      PT SHORT TERM GOAL #1   Title  Be independent with initial home exercise program for self-management of symptoms.    Baseline  Initial HEP provided at IE (03/14/2019);     Time  2    Period  Weeks    Status  New    Target Date  03/28/19        PT Long Term Goals - 03/14/19 1724      PT LONG TERM GOAL #1   Title  Be independent with a long-term home exercise program for self-management of symptoms.     Baseline  Initial HEP provided at IE (03/14/2019);     Time  8    Period  Weeks    Status  New    Target Date  05/09/19      PT LONG TERM GOAL #2   Title  Demonstrate improved FOTO score by 10 units to demonstrate improvement in overall condition and self-reported functional ability.     Baseline  FOTO = 26 (03/14/2019);     Time  8    Period  Weeks    Status  New    Target Date  05/09/19      PT LONG TERM GOAL #3   Title  Patient will demonstrate 0 degrees AROM knee extension without pain to improve function for weight bearing activities.     Baseline  lacking 5 degrees painful (03/14/2019);     Time  8    Period  Weeks    Status  New    Target Date  05/09/19      PT LONG TERM GOAL #4   Title  Patient will  demonstrate R knee flexion strength in prone at least 4+/5 with no pain with tibia internally rotated  to improve patient's ability to complete functional activities such as stepping, swing phase of gait.     Baseline  painful 4/5 (03/14/2019);     Time  8    Period  Weeks    Status  New    Target Date  05/09/19      PT LONG TERM GOAL #5   Title  Complete community, work and/or recreational activities without limitation due to current condition.     Baseline  difficulty walking, bending, kneeling, being active, sitting following walking, getting up and down from floor, activities requiring use of R LE (03/14/2019);     Time  8    Period  Weeks    Status  New    Target Date  05/09/19            Plan - 03/29/19 1739    Clinical Impression Statement  Patient tolerated treatment well and is making appropriate progress toward goals. He continues to show improved form and ability to progress to more complicated exercises further targeting hamstrings for improved eccentric strengthening to improve tissue integrity, strength, and fiber alignment and reduce pain. Educated about potential DOMS and appropriate response. Pt required multimodal cuing for proper technique and to facilitate improved neuromuscular control, strength, range of motion, and functional ability resulting in improved performance and form. Patient would benefit from continued physical therapy to address remaining impairments and functional limitations to work towards stated goals and return to PLOF or maximal functional independence.    Personal Factors and Comorbidities  Comorbidity 3+    Comorbidities   pituitary macroadenoma (currently being successfully treated with medication, benign), hypogonadotropic hypogonadism syndrome, partial hypopituitarism, fatty liver, benign cyst of R kidney, pre-diabetes, appendectomy, biliary dyskinesia, allergic rhinitis, sensitivity to latex, obesity.     Examination-Activity Limitations   Squat;Stand;Sit;Carry;Lift;Locomotion Level;Other;Bend   general activity, getting up and down from floor, getting up and down from sitting, general multiplane motions involving R LE.    Examination-Participation Restrictions  Yard Work;Other;Community Activity;Interpersonal Relationship  participating in usual social and home activities   Stability/Clinical Decision Making  Evolving/Moderate complexity   unclear eitiology; not improving as expected   Rehab Potential  Good    PT Frequency  2x / week    PT Duration  8 weeks    PT Treatment/Interventions  ADLs/Self Care Home Management;Cryotherapy;Moist Heat;Gait training;Functional mobility training;Therapeutic activities;Therapeutic exercise;Balance training;Neuromuscular re-education;Patient/family education;Manual techniques;Passive range of motion;Dry needling;Taping;Spinal Manipulations;Joint Manipulations   joint mobilizations grades I-IV   PT Next Visit Plan  assess response to HEP and update as appropriate; consider starting eccentric hamstring activities and further assess functional activities.     PT Home Exercise Plan  Medbridge Access Code: [email or web address]     Consulted and Agree with Plan of Care  Patient       Patient will benefit from skilled therapeutic intervention in order to improve the following deficits and impairments:  Abnormal gait, Decreased activity tolerance, Decreased endurance, Decreased range of motion, Decreased strength, Impaired perceived functional ability, Pain, Obesity, Increased muscle spasms, Difficulty walking, Decreased mobility  Visit Diagnosis: Right knee pain, unspecified chronicity  Difficulty in walking, not elsewhere classified  Stiffness of right knee, not elsewhere classified     Problem List Patient Active Problem List   Diagnosis Date Noted  . Sinusitis 07/11/2018  . Prediabetes 02/16/2018  . Arthritis 01/31/2018  . Fatty liver 01/31/2018  . Allergic rhinitis 01/31/2018  . Pituitary  macroadenoma ([place]) 01/31/2018  . Biliary dyskinesia 03/10/2017  . Pain of upper abdomen   . 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 Neck nodule 06/12/2013  . Glucose intolerance (impaired glucose tolerance) 06/08/2013  . Male hypogonadism 06/08/2013  . Hyperprolactinemia ([place]) 08/19/2011  . Invasive pituitary adenoma ([place]) 08/19/2011  . 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 Graylon Good, PT, DPT 03/29/19, 5:46 PM  [facility] [address] [place], [address] Phone: [phone]   Fax:  [phone]  Name: Mackenzy Eisenberg MRN: [id number] Date of Birth: [birth year]

## 2019-04-03 ENCOUNTER — Ambulatory Visit: Payer: BC Managed Care – PPO | Admitting: Physical Therapy

## 2019-04-03 ENCOUNTER — Telehealth: Payer: Self-pay | Admitting: Physical Therapy

## 2019-04-03 NOTE — Telephone Encounter (Signed)
Called patient when he did not show up for his 4:30 pm appointment today. Patient answered and stated he just got home from work, that he should have come directly to PT from work. He agreed to reschedule to 3:30pm this Thursday 04/06/2019. Confirmed change with scheduling staff.    Everlean Alstrom. Graylon Good, PT, DPT 04/03/19, 6:37 PM

## 2019-04-05 ENCOUNTER — Encounter: Payer: Medicaid Other | Admitting: Physical Therapy

## 2019-04-06 ENCOUNTER — Other Ambulatory Visit: Payer: Self-pay

## 2019-04-06 ENCOUNTER — Ambulatory Visit: Payer: BC Managed Care – PPO | Admitting: Physical Therapy

## 2019-04-06 ENCOUNTER — Encounter: Payer: Self-pay | Admitting: Physical Therapy

## 2019-04-06 DIAGNOSIS — R262 Difficulty in walking, not elsewhere classified: Secondary | ICD-10-CM

## 2019-04-06 DIAGNOSIS — M25561 Pain in right knee: Secondary | ICD-10-CM | POA: Diagnosis not present

## 2019-04-06 DIAGNOSIS — M25661 Stiffness of right knee, not elsewhere classified: Secondary | ICD-10-CM

## 2019-04-06 NOTE — Therapy (Addendum)
[facility] [address] [place], [address] Phone: [phone]   Fax:  [phone]  Physical Therapy Treatment / Progress Note Reporting Period: 03/14/2019 - 04/06/2019  Patient Details  Name: Timothy Atkins MRN: [id number] Date of Birth: 05/16/1966 Referring Provider (PT): Perrin Maltese MD   Encounter Date: 04/06/2019  PT End of Session - 04/06/19 1537    Visit Number  4    Number of Visits  16    Date for PT Re-Evaluation  05/09/19    Authorization Type  Medicaid reporting period from 03/14/2019    Authorization Time Period  Current cert period: 8/58/8502 - 05/09/2019    Authorization - Visit Number  3    Authorization - Number of Visits  3    PT Start Time  7741    PT Stop Time  1615    PT Time Calculation (min)  40 min    Activity Tolerance  Patient tolerated treatment well    Behavior During Therapy  [facility] for tasks assessed/performed       Past Medical History:  Diagnosis Date  . Arthritis   . Benign cyst of right kidney   . Constipation   . Degenerative cervical disc    mild   . Fatty liver   . Fatty liver   . Glucose intolerance (impaired glucose tolerance) 06/08/2013  . Hyperlipidemia   . Hyperprolactinemia ([place]) 08/19/2011   Overview:  05/2004 1363 ng/ml  . 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 Invasive pituitary adenoma ([place]) 08/19/2011  . Male hypogonadism 06/08/2013  . Neck nodule 06/12/2013  . 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 Pituitary tumor   . Prediabetes   . Rosacea   . RUQ pain 03/08/2017   Post RUQ that radiates Ant RUQ. 3 severe episodes over 3 weeks    Past Surgical History:  Procedure  Laterality Date  . APPENDECTOMY  as child  . TONSILLECTOMY  as child    There were no vitals filed for this visit.  Subjective Assessment - 04/06/19 1538    Subjective  Patient reports he is feeling under the weather today but does not have any fever, sore throat, or nasal congestion. He does have body aches and increased pain in hs hamstring today. He reports his symptoms today are familiar to him and related to his pituitary tumor. He reports he is willing to do what his body will let him. He reports he felt like he had heat stroke after he got home following his last PT treatment session, feeling nausea. He stayed home, drank some fluid, layed down for about 1.5 hours and then felt better after that. He feels no significant difference in his cheif complaint or function since he started physical therapy.    Pertinent History  Patient is a 53 y.o. male who presents to outpatient physical therapy with a referral for medical diagnosis R knee pain. This patient's chief complaints consist of posterior and knee and hamstring pain, knee stiffness and interference with usual activities, leading to the following functional deficits: difficulty walking, bending, kneeling, being active, sitting following walking. Relevant past medical history and comorbidities include pituitary macroadenoma (currently being successfully treated with medication, benign),  hypogonadotropic hypogonadism syndrome, partial hypopituitarism, fatty liver, benign cyst of R kidney, pre-diabetes, appendectomy, biliary dyskinesia, allergic rhinitis, sensitivity to latex.    Limitations  Sitting;Walking;Lifting;House hold activities;Other (comment)   usual activities around the house are harder or limited including getting up and down from the floor, anything that requires use of R LE   Diagnostic tests  Radiograph from 01/15/2017: FINDINGS:  Mild medial femorotibial narrowing with tibial spine spurring. Small superior patellar osteophyte. No  acute fracture or dislocation. Small knee effusion. No soft tissue swelling.    Patient Stated Goals  to get better: 80-90% would be good    Currently in Pain?  Yes    Pain Score  4     Pain Location  Leg    Pain Orientation  Right;Posterior    Pain Descriptors / Indicators  Tightness;Other (Comment)   tight   Pain Type  Chronic pain    Pain Radiating Towards  R ischial tuberosity to R calf    Pain Onset  More than a month ago         [facility] PT Assessment - 04/06/19 0001      Assessment   Medical Diagnosis  R knee pain    Referring Provider (PT)  Perrin Maltese MD    Onset Date/Surgical Date  01/12/19    Hand Dominance  Left   some things done with R   Next MD Visit  one week from now    Prior Therapy  no previous PT      Precautions   Precautions  None      Restrictions   Weight Bearing Restrictions  No      Home Environment   Living Environment  --   no concerns about home     Prior Function   Level of Independence  Independent    Vocation  Full time employment    Vocation Requirements  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       Cognition   Overall Cognitive Status  Within Functional Limits for tasks assessed      Observation/Other Assessments   Observations  see note from 03/14/2019 for latest objective data         OBJECTIVE MEASURES  AROM  R knee extension = lacking 5 degrees, painful at end range  STRENGTH Eccentric R knee flexion with tibia internally rotated: 4/5 and painful  PALPATION:  TTP along entire R medial hamstring muscles    TREATMENT: **CONFIRMS SENSITIVITY TO LATEX** Denies history of spinal surgery Denies long term use of steroid medication.  Therapeutic exercise:to centralize symptoms and improve ROM, strength, muscular endurance, and activity tolerance required for successful completion of  functional activities. - ROM and strength measurement to assess progress (see above).  - prone self-assisted eccentric R hamstring curl. 2x10.  - Curl machine eccentric R hamstring curl in seated position. 45# 2x10.  - kickstand deadlifts with 10# dumbbell 2x 10 each side to translate bar technique to technique that can be adapted with weighted pillow case at home.   - SL [place] deadlift 10# dumbbell to 9 inch stool with light support with contralateral UE x10 each side, to futher isolate eccentric hamstring flexion.  -Education on diagnosis, prognosis, POC, anatomy and physiology of current condition.   Manual therapy: to reduce pain and tissue tension, improve  range of motion, neuromodulation, in order to promote improved ability to complete functional activities. - STM to R medial hamstrings and adductors to decrease tension and pain. Pt TTP along entire R medial hamstrings.   HOME EXERCISE PROGRAM Access Code: [email or web address]  URL: [email or web address]  Date: 03/14/2019  Prepared by: Rosita Kea   Exercises   Seated Correct Posture   Seated Quad Set - 10 reps - 5 hold - 4x daily   Prone Press Up - 10-15 reps - 1 second hold - 4-6x daily   Patient tolerated treatment well today with no symptoms of discomfort due to heat. Was more tender than previously to hamstring pain, which is consistent with overall elevation in achiness and pain that patient attributes to similar episodes he gets from pituitary tumor. Today's exercises were kept lighter to accommodate him not feeling well today. He had no overall increase in pain. Pt required multimodal cuing for proper technique and to facilitate improved neuromuscular control, strength, range of motion, and functional ability resulting in improved performance and form. Patient would benefit from continued physical therapy to address remaining impairments and functional limitations to work towards stated goals and return to PLOF or  maximal functional independence.    PT Education - 04/06/19 1644    Education Details  Exercise purpose/form. Self management techniques. Education on diagnosis, prognosis, POC, anatomy and physiology of current condition    Person(s) Educated  Patient    Methods  Explanation;Demonstration;Tactile cues;Verbal cues    Comprehension  Returned demonstration;Verbal cues required;Verbalized understanding;Tactile cues required       PT Short Term Goals - 04/06/19 1640      PT SHORT TERM GOAL #1   Title  Be independent with initial home exercise program for self-management of symptoms.    Baseline  Initial HEP provided at IE (03/14/2019);     Time  2    Period  Weeks    Status  Achieved    Target Date  03/28/19        PT Long Term Goals - 04/06/19 1641      PT LONG TERM GOAL #1   Title  Be independent with a long-term home exercise program for self-management of symptoms.     Baseline  Initial HEP provided at IE (03/14/2019); patient currently participating in HEP as presscribed but has not yet received long term HEP (04/06/2019);    Time  8    Period  Weeks    Status  Partially Met    Target Date  05/09/19      PT LONG TERM GOAL #2   Title  Demonstrate improved FOTO score by 10 units to demonstrate improvement in overall condition and self-reported functional ability.     Baseline  FOTO = 26 (03/14/2019); FOTO = 24 (04/06/2019);    Time  8    Period  Weeks    Status  On-going    Target Date  05/09/19      PT LONG TERM GOAL #3   Title  Patient will demonstrate 0 degrees AROM knee extension without pain to improve function for weight bearing activities.     Baseline  lacking 5 degrees painful (03/14/2019); lacking 5 degrees painful (04/06/2019)    Time  8    Period  Weeks    Status  On-going    Target Date  05/09/19      PT LONG TERM GOAL #4   Title  Patient will demonstrate R knee flexion strength in prone at least  4+/5 with no pain with tibia internally rotated  to improve  patient's ability to complete functional activities such as stepping, swing phase of gait.     Baseline  painful 4/5 (03/14/2019); painful 4/5 (04/06/2019)    Time  8    Period  Weeks    Status  On-going    Target Date  05/09/19      PT LONG TERM GOAL #5   Title  Complete community, work and/or recreational activities without limitation due to current condition.     Baseline  difficulty walking, bending, kneeling, being active, sitting following walking, getting up and down from floor, activities requiring use of R LE (03/14/2019, 04/06/2019);    Time  8    Period  Weeks    Status  On-going    Target Date  05/09/19            Plan - 04/06/19 1639    Clinical Impression Statement  Patient has attended 1 initial evaluation and 3 follow up treatment sessions this episode of care. He was not feeling well at this session due to symptoms from his pituitary tumor that increase pain throughout his entire body. He has had difficulty with attendance due to unexpected work conflicts. This has been discussed with him and he agrees the proposed upcoming schedule for further treatment is at times he can attend. Patient has been completing HEP as prescribed per pt report. Per observation, patient has advanced in his ability to properly perform dead lift exercise that targets eccentric strengthening of the hamstring muscles and can induce improvements in collagen fiber strength and position to reduce pain and improve function in chronic hamstring tears or tendinopathy that is consistent with patient's presenting symptoms. This condition often takes consistent intervention over an extended period to improve and improvements after 3 treatment sessions is not expected and is consistent with patient's presentation. His FOTO score has worsened since initial eval, consistent with the increased amount of fatigue and achiness he is experiencing today related to his pituitary tumor, not his chief physical therapy  complaint. Patient is making appropriate progress at this point but requires additional treatment sessions to continue working towards goals. Patient presents with significant ROM, pain, strength with painful movements, tissue tension, and activity tolerance impairments that are limiting ability to complete his usual activities such as walking, bending, twisting, floor and chair transfers, and being generally active without difficulty. Patient will benefit from continued skilled physical therapy intervention as outlined in original plan of care to address current body structure impairments and activity limitations to improve function and work towards goals set in current POC in order to return to prior level of function or maximal functional improvement.    Personal Factors and Comorbidities  Comorbidity 3+    Comorbidities   pituitary macroadenoma (currently being successfully treated with medication, benign), hypogonadotropic hypogonadism syndrome, partial hypopituitarism, fatty liver, benign cyst of R kidney, pre-diabetes, appendectomy, biliary dyskinesia, allergic rhinitis, sensitivity to latex, obesity.     Examination-Activity Limitations  Squat;Stand;Sit;Carry;Lift;Locomotion Level;Other;Bend   general activity, getting up and down from floor, getting up and down from sitting, general multiplane motions involving R LE.    Examination-Participation Restrictions  Yard Work;Other;Community Activity;Interpersonal Relationship   participating in usual social and home activities   Stability/Clinical Decision Making  Evolving/Moderate complexity   unclear eitiology; not improving as expected   Rehab Potential  Good    PT Frequency  2x / week    PT Duration  8 weeks  PT Treatment/Interventions  ADLs/Self Care Home Management;Cryotherapy;Moist Heat;Gait training;Functional mobility training;Therapeutic activities;Therapeutic exercise;Balance training;Neuromuscular re-education;Patient/family  education;Manual techniques;Passive range of motion;Dry needling;Taping;Spinal Manipulations;Joint Manipulations   joint mobilizations grades I-IV   PT Next Visit Plan  continue with eccentric hamstring and LE and functional strengthening activities    PT Home Exercise Plan  Medbridge Access Code: [email or web address]     Consulted and Agree with Plan of Care  Patient       Patient will benefit from skilled therapeutic intervention in order to improve the following deficits and impairments:  Abnormal gait, Decreased activity tolerance, Decreased endurance, Decreased range of motion, Decreased strength, Impaired perceived functional ability, Pain, Obesity, Increased muscle spasms, Difficulty walking, Decreased mobility  Visit Diagnosis: Right knee pain, unspecified chronicity   Difficulty in walking, not elsewhere classified   Stiffness of right knee, not elsewhere classified      Problem List Patient Active Problem List   Diagnosis Date Noted  . Sinusitis 07/11/2018  . Prediabetes 02/16/2018  . Arthritis 01/31/2018  . Fatty liver 01/31/2018  . Allergic rhinitis 01/31/2018  . Pituitary macroadenoma ([place]) 01/31/2018  . Biliary dyskinesia 03/10/2017  . Pain of upper abdomen   . 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 Neck nodule 06/12/2013  . Glucose intolerance (impaired glucose tolerance) 06/08/2013  . Male hypogonadism 06/08/2013  . Hyperprolactinemia ([place]) 08/19/2011  . Invasive pituitary adenoma ([place]) 08/19/2011  . 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 Graylon Good, PT, DPT 04/06/19, 4:47 PM  [facility] [address], [place], [address] Phone: [phone]   Fax:  [phone]  Name: Srihith Aquilino MRN: [id number] Date of Birth: Mar 05, 1966

## 2019-04-10 ENCOUNTER — Ambulatory Visit: Payer: BC Managed Care – PPO | Admitting: Physical Therapy

## 2019-04-12 ENCOUNTER — Ambulatory Visit: Payer: BC Managed Care – PPO | Admitting: Physical Therapy

## 2019-04-13 ENCOUNTER — Encounter: Payer: Self-pay | Admitting: Physical Therapy

## 2019-04-13 ENCOUNTER — Ambulatory Visit: Payer: BC Managed Care – PPO | Admitting: Physical Therapy

## 2019-04-13 ENCOUNTER — Other Ambulatory Visit: Payer: Self-pay

## 2019-04-13 DIAGNOSIS — M25661 Stiffness of right knee, not elsewhere classified: Secondary | ICD-10-CM

## 2019-04-13 DIAGNOSIS — M25561 Pain in right knee: Secondary | ICD-10-CM | POA: Diagnosis not present

## 2019-04-13 DIAGNOSIS — R262 Difficulty in walking, not elsewhere classified: Secondary | ICD-10-CM

## 2019-04-13 NOTE — Therapy (Signed)
[facility] [address] [place], [address] Phone: [phone]   Fax:  [phone]  Physical Therapy Treatment  Patient Details  Name: Timothy Atkins MRN: [id number] Date of Birth: 1966/04/01 Referring Provider (PT): Perrin Maltese MD   Encounter Date: 04/13/2019  PT End of Session - 04/13/19 1228    Visit Number  5    Number of Visits  16    Date for PT Re-Evaluation  05/09/19    Authorization Type  Medicaid reporting period from 03/14/2019    Authorization Time Period  Current cert period: 3/84/5364 - 05/09/2019    Authorization - Visit Number  1    Authorization - Number of Visits  12    PT Start Time  1135    PT Stop Time  1215    PT Time Calculation (min)  40 min    Activity Tolerance  Patient tolerated treatment well    Behavior During Therapy  [facility] for tasks assessed/performed       Past Medical History:  Diagnosis Date  . Arthritis   . Benign cyst of right kidney   . Constipation   . Degenerative cervical disc    mild   . Fatty liver   . Fatty liver   . Glucose intolerance (impaired glucose tolerance) 06/08/2013  . Hyperlipidemia   . Hyperprolactinemia ([place]) 08/19/2011   Overview:  05/2004 1363 ng/ml  . 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 Invasive pituitary adenoma ([place]) 08/19/2011  . Male hypogonadism 06/08/2013  . Neck nodule 06/12/2013  . 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 Pituitary tumor   . Prediabetes   . Rosacea   . RUQ pain 03/08/2017   Post RUQ that radiates Ant RUQ. 3 severe episodes over 3 weeks    Past Surgical History:  Procedure Laterality Date  . APPENDECTOMY  as child  .  TONSILLECTOMY  as child    There were no vitals filed for this visit.  Subjective Assessment - 04/13/19 1159    Subjective  Patient reports he is feeling well today and not with general malaise as at last treatment session. He reports he had increased soreness and pain in his R hamstring region following last treatment session that required him to take celebrex for the last two days and is now feeling better. He states he did more physical labor at his work yesterday and that was better than before. He reports when he had increased pain he had paresthesia into his R heel and top of R foot. He reports 4/10 pain upon arrival in R posterior  thigh, medial groin to knee.    Pertinent History  Patient is a 53 y.o. male who presents to outpatient physical therapy with a referral for medical diagnosis R knee pain. This patient's chief complaints consist of posterior and knee and hamstring pain, knee stiffness and interference with usual activities, leading to the following functional deficits: difficulty walking, bending, kneeling, being active, sitting following walking. Relevant past medical history and comorbidities include pituitary macroadenoma (currently being successfully treated with medication, benign), hypogonadotropic hypogonadism syndrome, partial hypopituitarism, fatty liver, benign cyst of R kidney, pre-diabetes, appendectomy, biliary dyskinesia, allergic rhinitis, sensitivity to latex.  Limitations  Sitting;Walking;Lifting;House hold activities;Other (comment)   usual activities around the house are harder or limited including getting up and down from the floor, anything that requires use of R LE   Diagnostic tests  Radiograph from 01/15/2017: FINDINGS:  Mild medial femorotibial narrowing with tibial spine spurring. Small superior patellar osteophyte. No acute fracture or dislocation. Small knee effusion. No soft tissue swelling.    Patient Stated Goals  to get better: 80-90% would be good     Currently in Pain?  Yes    Pain Score  4     Pain Location  Leg    Pain Orientation  Right;Posterior    Pain Onset  More than a month ago          TREATMENT: **CONFIRMS SENSITIVITY TO LATEX** Denies history of spinal surgery Denies long term use of steroid medication.  Therapeutic exercise:to centralize symptoms and improve ROM, strength, muscular endurance, and activity tolerance required for successful completion of functional activities. -Treadmill2.5 mph with nograde and no UE support.For improved lower extremity mobility, muscular endurance, and weightbearing activity tolerance; and to induce the analgesic effect of aerobic exercise, stimulate improved joint nutrition, and prepare body structures and systems for following interventions. x5 min during subjective exam.  - prone press up x10 (last three with self OP using breathing). improved back comfort but no effect on hamstring region.  - prone self-assisted eccentric R hamstring curl. 2x10.  - Curl machine eccentric R hamstring curl in seated position. 35# 2x10.  - supine hamstring stretch R side only 3x30 seconds with strap. -Education on diagnosis, prognosis, POC, anatomy and physiology of current condition. - Education on HEP including handout   Manual therapy: to reduce pain and tissue tension, improve range of motion, neuromodulation, in order to promote improved ability to complete functional activities. - prone CPA with wedge x 10 at each lumbar level and x 5 over lower thoracic segments grades II-IV to improve motion and tolerance/effectiveness of prone press up. Had some referral laterally on R near L4. Improved back comfort, no effect on hamstring.   - prone press up with clinician OP x10. No specifically effective or painful segment.  Improved back comfort, no effect on hamstring.    HOME EXERCISE PROGRAM Access Code: [email or web address]  URL: [email or web address]  Date: 03/14/2019  Prepared by: [facility] Created by Rosita Kea, PT, DPT Jun 18th, 2020 View videos at [email or web address] Total 3 Page 1 of 1 KICKSTAND DEADLIFT GOAL: maintain tripod position of foot ACTION: Weight through the front foot, back foot only for balance. Bend forward through the hip and slight bend in the knee, keeping spine in neutral while maintaining tripod position of foot. Return to upright by "pushing the floor away from you" through the big toe and ball of foot optional; hand at lower ribs and pelvis for abdominal control Repeat 10 Times Complete 3 Sets Perform 3 Times a Week Self resisted hamstring curls Cross L ankle over R ankle. bend both knees Then extend L knee pressing into R leg while R leg resists while allowing knee to straighten gradually. As we have done in clinic. Video # [id number] Repeat 10 Times Complete 3 Sets Perform 2 Times a Day Eccentric Hamstring Curls with Sliders Starting in bridge position with sliders under heels, lift hips up in the air. Next slowly lower body towards ground by sliding heels away from your body. Keeping glutes on the ground, bring heels back to starting position, bridge up lifting  hips, then slowly lower, repeat. Only focusing on eccentric (negative) component. Repeat 10 Times Complete 3 Sets Perform 2 Times a Day    PT Education - 04/13/19 1228    Education Details  Exercise purpose/form. Self management techniques. Education on diagnosis, prognosis, POC, anatomy and physiology of current condition    Person(s) Educated  Patient    Methods  Explanation;Demonstration;Tactile cues;Verbal cues;Handout    Comprehension  Verbalized understanding;Returned demonstration;Verbal cues required;Tactile cues required       PT Short Term Goals - 04/06/19 1640      PT SHORT TERM GOAL #1   Title  Be independent with initial home exercise program for self-management of symptoms.    Baseline  Initial HEP provided at IE (03/14/2019);     Time   2    Period  Weeks    Status  Achieved    Target Date  03/28/19        PT Long Term Goals - 04/06/19 1641      PT LONG TERM GOAL #1   Title  Be independent with a long-term home exercise program for self-management of symptoms.     Baseline  Initial HEP provided at IE (03/14/2019); patient currently participating in HEP as presscribed but has not yet received long term HEP (04/06/2019);    Time  8    Period  Weeks    Status  Partially Met    Target Date  05/09/19      PT LONG TERM GOAL #2   Title  Demonstrate improved FOTO score by 10 units to demonstrate improvement in overall condition and self-reported functional ability.     Baseline  FOTO = 26 (03/14/2019); FOTO = 24 (04/06/2019);    Time  8    Period  Weeks    Status  On-going    Target Date  05/09/19      PT LONG TERM GOAL #3   Title  Patient will demonstrate 0 degrees AROM knee extension without pain to improve function for weight bearing activities.     Baseline  lacking 5 degrees painful (03/14/2019); lacking 5 degrees painful (04/06/2019)    Time  8    Period  Weeks    Status  On-going    Target Date  05/09/19      PT LONG TERM GOAL #4   Title  Patient will demonstrate R knee flexion strength in prone at least 4+/5 with no pain with tibia internally rotated  to improve patient's ability to complete functional activities such as stepping, swing phase of gait.     Baseline  painful 4/5 (03/14/2019); painful 4/5 (04/06/2019)    Time  8    Period  Weeks    Status  On-going    Target Date  05/09/19      PT LONG TERM GOAL #5   Title  Complete community, work and/or recreational activities without limitation due to current condition.     Baseline  difficulty walking, bending, kneeling, being active, sitting following walking, getting up and down from floor, activities requiring use of R LE (03/14/2019, 04/06/2019);    Time  8    Period  Weeks    Status  On-going    Target Date  05/09/19            Plan - 04/13/19  1227    Clinical Impression Statement  Patient tolerated treatment well and is making appropriate progress towards goals at this point. Interventions for the lumbar spine and  sciatic nerve distribution were resumed due to reports of paresthesia that are more significant for neural involvement. Patient reported improved lumbar comfort but no significant change in posterior thigh pain. Repeated eccentric exercises for hamstrings to continue addressing this region. Adjusted hamstring curl machine and dropped weight to 35# for improved comfort. Patient reported decreased pain by end of session. Updated HEP for more focus on eccentric hamstring exercises. Pt required multimodal cuing for proper technique and to facilitate improved neuromuscular control, strength, range of motion, and functional ability resulting in improved performance and form. Patient would benefit from continued physical therapy to address remaining impairments and functional limitations to work towards stated goals and return to PLOF or maximal functional independence.    Personal Factors and Comorbidities  Comorbidity 3+    Comorbidities   pituitary macroadenoma (currently being successfully treated with medication, benign), hypogonadotropic hypogonadism syndrome, partial hypopituitarism, fatty liver, benign cyst of R kidney, pre-diabetes, appendectomy, biliary dyskinesia, allergic rhinitis, sensitivity to latex, obesity.     Examination-Activity Limitations  Squat;Stand;Sit;Carry;Lift;Locomotion Level;Other;Bend   general activity, getting up and down from floor, getting up and down from sitting, general multiplane motions involving R LE.    Examination-Participation Restrictions  Yard Work;Other;Community Activity;Interpersonal Relationship   participating in usual social and home activities   Stability/Clinical Decision Making  Evolving/Moderate complexity   unclear eitiology; not improving as expected   Rehab Potential  Good    PT  Frequency  2x / week    PT Duration  8 weeks    PT Treatment/Interventions  ADLs/Self Care Home Management;Cryotherapy;Moist Heat;Gait training;Functional mobility training;Therapeutic activities;Therapeutic exercise;Balance training;Neuromuscular re-education;Patient/family education;Manual techniques;Passive range of motion;Dry needling;Taping;Spinal Manipulations;Joint Manipulations   joint mobilizations grades I-IV   PT Next Visit Plan  continue with eccentric hamstring and LE and functional strengthening activities    PT Home Exercise Plan  Medbridge Access Code: [email or web address]     Consulted and Agree with Plan of Care  Patient       Patient will benefit from skilled therapeutic intervention in order to improve the following deficits and impairments:  Abnormal gait, Decreased activity tolerance, Decreased endurance, Decreased range of motion, Decreased strength, Impaired perceived functional ability, Pain, Obesity, Increased muscle spasms, Difficulty walking, Decreased mobility  Visit Diagnosis: 1. Right knee pain, unspecified chronicity   2. Difficulty in walking, not elsewhere classified   3. Stiffness of right knee, not elsewhere classified        Problem List Patient Active Problem List   Diagnosis Date Noted  . Sinusitis 07/11/2018  . Prediabetes 02/16/2018  . Arthritis 01/31/2018  . Fatty liver 01/31/2018  . Allergic rhinitis 01/31/2018  . Pituitary macroadenoma ([place]) 01/31/2018  . Biliary dyskinesia 03/10/2017  . Pain of upper abdomen   . 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 Neck nodule 06/12/2013  . Glucose intolerance (impaired glucose tolerance) 06/08/2013  . Male hypogonadism 06/08/2013  . Hyperprolactinemia ([place]) 08/19/2011  . Invasive pituitary adenoma ([place]) 08/19/2011  . 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 Graylon Good, PT, DPT 04/13/19, 12:30 PM  [facility] [address] [place], [address] Phone: [phone]   Fax:  [phone]  Name: Timothy Atkins MRN: [id number] Date of Birth: 12-30-1965

## 2019-04-18 ENCOUNTER — Encounter: Payer: Self-pay | Admitting: Physical Therapy

## 2019-04-18 ENCOUNTER — Other Ambulatory Visit: Payer: Self-pay

## 2019-04-18 ENCOUNTER — Ambulatory Visit: Payer: BC Managed Care – PPO | Admitting: Physical Therapy

## 2019-04-18 DIAGNOSIS — M25661 Stiffness of right knee, not elsewhere classified: Secondary | ICD-10-CM

## 2019-04-18 DIAGNOSIS — R262 Difficulty in walking, not elsewhere classified: Secondary | ICD-10-CM

## 2019-04-18 DIAGNOSIS — M25561 Pain in right knee: Secondary | ICD-10-CM

## 2019-04-18 NOTE — Therapy (Signed)
[facility] [facility] [address] [place], [address] Phone: [phone]   Fax:  [phone]  Physical Therapy Treatment  Patient Details  Name: Timothy Atkins MRN: [id number] Date of Birth: 04-06-1966 Referring Provider (PT): Perrin Maltese MD   Encounter Date: 04/18/2019  PT End of Session - 04/18/19 1640    Visit Number  6    Number of Visits  16    Date for PT Re-Evaluation  05/09/19    Authorization Type  Medicaid reporting period from 03/14/2019    Authorization Time Period  Current cert period: 03/03/3266 - 05/09/2019    Authorization - Visit Number  2    Authorization - Number of Visits  12    PT Start Time  1635    PT Stop Time  1715    PT Time Calculation (min)  40 min    Activity Tolerance  Patient tolerated treatment well    Behavior During Therapy  [facility] for tasks assessed/performed       Past Medical History:  Diagnosis Date  . Arthritis   . Benign cyst of right kidney   . Constipation   . Degenerative cervical disc    mild   . Fatty liver   . Fatty liver   . Glucose intolerance (impaired glucose tolerance) 06/08/2013  . Hyperlipidemia   . Hyperprolactinemia ([place]) 08/19/2011   Overview:  05/2004 1363 ng/ml  . 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 Invasive pituitary adenoma ([place]) 08/19/2011  . Male hypogonadism 06/08/2013  . Neck nodule 06/12/2013  . 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 Pituitary tumor   . Prediabetes   . Rosacea   . RUQ pain 03/08/2017   Post RUQ that radiates Ant RUQ. 3 severe episodes over 3 weeks    Past Surgical History:  Procedure Laterality Date  . APPENDECTOMY  as child  .  TONSILLECTOMY  as child    There were no vitals filed for this visit.   TREATMENT: **CONFIRMS SENSITIVITY TO LATEX** Denies history of spinal surgery Denies long term use of steroid medication.  Therapeutic exercise:to centralize symptoms and improve ROM, strength, muscular endurance, and activity tolerance required for successful completion of functional activities. -Treadmill2.5 mph with nograde and no UE support.For improved lower extremity mobility, muscular endurance, and weightbearing activity tolerance; and to induce the analgesic effect of aerobic exercise, stimulate improved joint nutrition, and prepare body structures and systems for following interventions. x5 min during subjective exam.  - prone with head of bed elevated stretch x 3 min to assess effect on lumbar spine.  - prone press up x15 (10 with clinician OP) with hands on elevated surface to get full extension. - R sciatic nerve glides x10 no change.  - Curl machine eccentric R hamstring curl in seated position. 35# 2x10. -Education on diagnosis, prognosis, POC, anatomy and physiology of current condition. Discussed dry needling possibly at next visit, and patient would like to try this intervention.   Manual therapy: to reduce pain and tissue tension, improve range of motion, neuromodulation, in order to promote improved ability to complete functional activities. - attempted long axis distraction through R leg grade III-IV with no effect.  - prone CPA  with wedge x 10 at 2 lumbar segments near L3 and L4 grades II-IV to improve motion and pain. No effect. - STM to R medial hamstrings along entire muscle belly with and without LAX assisted IASTM. Pt reports most painful in most distal and proximal segments of muscle with minimal pain in mid-belly.  - manually resisted eccentric hamstring curl biasing medial hamstrings x 10, light resistance.      HOME EXERCISE PROGRAM Access Code: [email or web address]  URL:  [email or web address]  Date: 03/14/2019  Prepared by: [facility] Created by Rosita Kea, PT, DPT Jun 18th, 2020 View videos at [email or web address] Total 3 Page 1 of 1 KICKSTAND DEADLIFT GOAL: maintain tripod position of foot ACTION: Weight through the front foot, back foot only for balance. Bend forward through the hip and slight bend in the knee, keeping spine in neutral while maintaining tripod position of foot. Return to upright by "pushing the floor away from you" through the big toe and ball of foot optional; hand at lower ribs and pelvis for abdominal control Repeat 10 Times Complete 3 Sets Perform 3 Times a Week Self resisted hamstring curls Cross L ankle over R ankle. bend both knees Then extend L knee pressing into R leg while R leg resists while allowing knee to straighten gradually. As we have done in clinic. Video # [id number] Repeat 10 Times Complete 3 Sets Perform 2 Times a Day Eccentric Hamstring Curls with Sliders Starting in bridge position with sliders under heels, lift hips up in the air. Next slowly lower body towards ground by sliding heels away from your body. Keeping glutes on the ground, bring heels back to starting position, bridge up lifting hips, then slowly lower, repeat. Only focusing on eccentric (negative) component. Repeat 10 Times Complete 3 Sets Perform 2 Times a Day   PT Education - 04/18/19 1640    Education Details  Exercise purpose/form. Self management techniques. Education on diagnosis, prognosis, POC, anatomy and physiology of current condition    Person(s) Educated  Patient    Methods  Explanation;Demonstration;Tactile cues;Verbal cues    Comprehension  Verbalized understanding;Returned demonstration;Verbal cues required;Tactile cues required       PT Short Term Goals - 04/06/19 1640      PT SHORT TERM GOAL #1   Title  Be independent with initial home exercise program for self-management of  symptoms.    Baseline  Initial HEP provided at IE (03/14/2019);     Time  2    Period  Weeks    Status  Achieved    Target Date  03/28/19        PT Long Term Goals - 04/06/19 1641      PT LONG TERM GOAL #1   Title  Be independent with a long-term home exercise program for self-management of symptoms.     Baseline  Initial HEP provided at IE (03/14/2019); patient currently participating in HEP as presscribed but has not yet received long term HEP (04/06/2019);    Time  8    Period  Weeks    Status  Partially Met    Target Date  05/09/19      PT LONG TERM GOAL #2   Title  Demonstrate improved FOTO score by 10 units to demonstrate improvement in overall condition and self-reported functional ability.     Baseline  FOTO = 26 (03/14/2019); FOTO = 24 (04/06/2019);    Time  8    Period  Weeks    Status  On-going    Target Date  05/09/19      PT LONG TERM GOAL #3   Title  Patient will demonstrate 0 degrees AROM knee extension without pain to improve function for weight bearing activities.     Baseline  lacking 5 degrees painful (03/14/2019); lacking 5 degrees painful (04/06/2019)    Time  8    Period  Weeks    Status  On-going    Target Date  05/09/19      PT LONG TERM GOAL #4   Title  Patient will demonstrate R knee flexion strength in prone at least 4+/5 with no pain with tibia internally rotated  to improve patient's ability to complete functional activities such as stepping, swing phase of gait.     Baseline  painful 4/5 (03/14/2019); painful 4/5 (04/06/2019)    Time  8    Period  Weeks    Status  On-going    Target Date  05/09/19      PT LONG TERM GOAL #5   Title  Complete community, work and/or recreational activities without limitation due to current condition.     Baseline  difficulty walking, bending, kneeling, being active, sitting following walking, getting up and down from floor, activities requiring use of R LE (03/14/2019, 04/06/2019);    Time  8    Period  Weeks    Status   On-going    Target Date  05/09/19            Plan - 04/18/19 1738    Clinical Impression Statement  Patient tolerated treatment well and continues to demonstrate within-visit improvements in response to manual therapy and exercise. His continues to have variability of symptoms between visits that are quite restricting to him. He responded well today to [facility] to R medial hamstrings followed by gentle exercise. Plan to consider dry needling at next treatment session for potential for stronger and more sustained relief. Patient continues to report rapidly changing symptoms that mimic lumbar radicular pain, but interventions targeting the lumbar spine appear to provide minimal effect on region of pain in the leg. Pt required multimodal cuing for proper technique and to facilitate improved neuromuscular control, strength, range of motion, and functional ability resulting in improved performance and form. Patient would benefit from continued physical therapy to address remaining impairments and functional limitations to work towards stated goals and return to PLOF or maximal functional independence.    Personal Factors and Comorbidities  Comorbidity 3+    Comorbidities   pituitary macroadenoma (currently being successfully treated with medication, benign), hypogonadotropic hypogonadism syndrome, partial hypopituitarism, fatty liver, benign cyst of R kidney, pre-diabetes, appendectomy, biliary dyskinesia, allergic rhinitis, sensitivity to latex, obesity.     Examination-Activity Limitations  Squat;Stand;Sit;Carry;Lift;Locomotion Level;Other;Bend   general activity, getting up and down from floor, getting up and down from sitting, general multiplane motions involving R LE.    Examination-Participation Restrictions  Yard Work;Other;Community Activity;Interpersonal Relationship   participating in usual social and home activities   Stability/Clinical Decision Making  Evolving/Moderate complexity   unclear  eitiology; not improving as expected   Rehab Potential  Good    PT Frequency  2x / week    PT Duration  8 weeks    PT Treatment/Interventions  ADLs/Self Care Home Management;Cryotherapy;Moist Heat;Gait training;Functional mobility training;Therapeutic activities;Therapeutic exercise;Balance training;Neuromuscular re-education;Patient/family education;Manual techniques;Passive range of motion;Dry needling;Taping;Spinal Manipulations;Joint Manipulations   joint mobilizations grades I-IV   PT Next Visit Plan  consider dry needling. continue with eccentric hamstring and LE  and functional strengthening activities    PT Home Exercise Plan  Medbridge Access Code: [email or web address]     Consulted and Agree with Plan of Care  Patient       Patient will benefit from skilled therapeutic intervention in order to improve the following deficits and impairments:  Abnormal gait, Decreased activity tolerance, Decreased endurance, Decreased range of motion, Decreased strength, Impaired perceived functional ability, Pain, Obesity, Increased muscle spasms, Difficulty walking, Decreased mobility  Visit Diagnosis: 1. Right knee pain, unspecified chronicity   2. Difficulty in walking, not elsewhere classified   3. Stiffness of right knee, not elsewhere classified        Problem List Patient Active Problem List   Diagnosis Date Noted  . Sinusitis 07/11/2018  . Prediabetes 02/16/2018  . Arthritis 01/31/2018  . Fatty liver 01/31/2018  . Allergic rhinitis 01/31/2018  . Pituitary macroadenoma ([place]) 01/31/2018  . Biliary dyskinesia 03/10/2017  . Pain of upper abdomen   . 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 Neck nodule 06/12/2013  . Glucose intolerance (impaired glucose tolerance) 06/08/2013  . Male hypogonadism 06/08/2013  . Hyperprolactinemia ([place]) 08/19/2011  . Invasive pituitary adenoma ([place]) 08/19/2011  . 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 Graylon Good, PT,  DPT 04/18/19, 5:40 PM  [place] [facility] [address] [place], [address] Phone: [phone]   Fax:  [phone]  Name: Timothy Atkins MRN: [id number] Date of Birth: 05-25-1966

## 2019-04-20 ENCOUNTER — Ambulatory Visit: Payer: BC Managed Care – PPO | Admitting: Physical Therapy

## 2019-04-20 ENCOUNTER — Encounter: Payer: Self-pay | Admitting: Physical Therapy

## 2019-04-20 ENCOUNTER — Other Ambulatory Visit: Payer: Self-pay

## 2019-04-20 DIAGNOSIS — M25661 Stiffness of right knee, not elsewhere classified: Secondary | ICD-10-CM

## 2019-04-20 DIAGNOSIS — M25561 Pain in right knee: Secondary | ICD-10-CM | POA: Diagnosis not present

## 2019-04-20 DIAGNOSIS — R262 Difficulty in walking, not elsewhere classified: Secondary | ICD-10-CM

## 2019-04-20 NOTE — Therapy (Signed)
[facility] [address], [place], [address] Phone: [phone]   Fax:  [phone]  Physical Therapy Treatment  Patient Details  Name: Timothy Atkins MRN: [id number] Date of Birth: 24-Oct-1966 Referring Provider (PT): Perrin Maltese MD   Encounter Date: 04/20/2019  PT End of Session - 04/20/19 1724    Visit Number  7    Number of Visits  16    Date for PT Re-Evaluation  05/09/19    Authorization Type  Medicaid reporting period from 03/14/2019    Authorization Time Period  Current cert period: 9/37/1696 - 05/09/2019    Authorization - Visit Number  3    Authorization - Number of Visits  12    PT Start Time  1630    PT Stop Time  1713    PT Time Calculation (min)  43 min    Activity Tolerance  Patient tolerated treatment well    Behavior During Therapy  [facility] for tasks assessed/performed       Past Medical History:  Diagnosis Date  . Arthritis   . Benign cyst of right kidney   . Constipation   . Degenerative cervical disc    mild   . Fatty liver   . Fatty liver   . Glucose intolerance (impaired glucose tolerance) 06/08/2013  . Hyperlipidemia   . Hyperprolactinemia ([place]) 08/19/2011   Overview:  05/2004 1363 ng/ml  . 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 Invasive pituitary adenoma ([place]) 08/19/2011  . Male hypogonadism 06/08/2013  . Neck nodule 06/12/2013  . 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 Pituitary tumor   . Prediabetes   . Rosacea   . RUQ pain 03/08/2017   Post RUQ that radiates Ant RUQ. 3 severe episodes over 3 weeks    Past Surgical History:  Procedure Laterality Date  . APPENDECTOMY  as child  .  TONSILLECTOMY  as child    There were no vitals filed for this visit.  Subjective Assessment - 04/20/19 1631    Subjective  Patient reports he is feeling more severe pain upon arrival. He just got done driving 1 hour back from work. He also drove 1 hour earlier today. Driving causes pain. He felt he could not get up or sit down yesterday. He took 2 tylenols and one celebrex which helped yesterday. He states he felt good when he left here he felt pretty good. He went home and helped his wife in the kitchen some then sat down for a couple of hours and when he got up it started to hurt again. The next morning (yesterday) it made it very hard to get out of bed. He stayed home from work because of the leg pain. Patient reports pain as 8/10 when not touching it upon arrival today.    Pertinent History  Patient is a 53 y.o. male who presents to outpatient physical therapy with a referral for medical diagnosis R knee pain. This patient's chief complaints consist of posterior and knee and hamstring pain, knee stiffness and interference with usual activities, leading to the following functional deficits: difficulty walking, bending, kneeling, being active, sitting following walking. Relevant past medical history and comorbidities include pituitary macroadenoma (currently  being successfully treated with medication, benign), hypogonadotropic hypogonadism syndrome, partial hypopituitarism, fatty liver, benign cyst of R kidney, pre-diabetes, appendectomy, biliary dyskinesia, allergic rhinitis, sensitivity to latex.    Limitations  Sitting;Walking;Lifting;House hold activities;Other (comment)   usual activities around the house are harder or limited including getting up and down from the floor, anything that requires use of R LE   Diagnostic tests  Radiograph from 01/15/2017: FINDINGS:  Mild medial femorotibial narrowing with tibial spine spurring. Small superior patellar osteophyte. No acute fracture or dislocation.  Small knee effusion. No soft tissue swelling.    Patient Stated Goals  to get better: 80-90% would be good    Currently in Pain?  Yes    Pain Score  8     Pain Location  Leg    Pain Orientation  Right;Posterior   thigh   Pain Radiating Towards  R ischial tuberosity to R calf    Pain Onset  More than a month ago         [facility] PT Assessment - 04/20/19 0001      Assessment   Medical Diagnosis  R knee pain    Referring Provider (PT)  Perrin Maltese MD    Onset Date/Surgical Date  01/12/19    Hand Dominance  Left   some things done with R   Next MD Visit  Maybe in August    Prior Therapy  no previous PT      Precautions   Precautions  None      Restrictions   Weight Bearing Restrictions  No      Home Environment   Living Environment  --   no concerns about home     Prior Function   Level of Independence  Independent    Vocation  Full time employment    Vocation Requirements  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       Cognition   Overall Cognitive Status  Within Functional Limits for tasks assessed      Observation/Other Assessments   Observations  see note from 03/14/2019 for latest objective data        TREATMENT: **CONFIRMS SENSITIVITY TO LATEX** Denies history of spinal surgery Denies long term use of steroid medication.  Therapeutic exercise:to centralize symptoms and improve ROM, strength, muscular endurance, and activity tolerance required for successful completion of functional activities. [employer].4mh with nograde and no UE support.For improved lower extremity mobility, muscular endurance, and weightbearing activity tolerance; and to induce the analgesic effect of aerobic exercise, stimulate improved joint nutrition, and prepare body structures and systems for following interventions. x5 min during subjective exam  and again after dry needling to assess response.  - one rep prone manually resisted eccentric hamstring curl with tibia in IR to bias medial hamstrings following dry needing to assess discomfort.  - Hooklying hip abduction with contralateral isometric hip abduction with red theraband wrapped around proximal tibias. To strengthen hips with minimal affect on hamstring region. No effect on hamstring region.  - hooklying abdminal brace with alternating march to strengthen abdominal muscles with minimal effect on hamstring muscles. 2x10 each side. No effect on hamstring region.  - prone press up 2x10 to improve lumbar extension and address any flexion intolerance that may be happening during travel. No effect on hamstring region.  -Education on diagnosis, prognosis, POC,  anatomy and physiology of current condition. Discussed dry needling possibly at next visit, and patient would like to try this intervention.   Modalities: to decrease pain and tension Dry Needling: (1) .3 x 119m and (1) .25 x 1061mneedles placed along the semimembranosus/semitendonosus junction on the R LE along distal aspect of the muscle  with patient positioned in prone. Patient verbally consents to treatment, educated on risks and benefits of treatment. Dry needling performed by WeUvaldo RisingPT, DPT and certified in trigger point dry needling.    HOME EXERCISE PROGRAM - Walking 20 min a day - prone press ups x 10, 6x a day and before and after traveling    PT Education - 04/20/19 1724    Education Details  Exercise purpose/form. Self management techniques. Education on diagnosis, prognosis, POC, anatomy and physiology of current condition    Person(s) Educated  Patient    Methods  Explanation;Demonstration;Tactile cues;Verbal cues    Comprehension  Verbalized understanding;Returned demonstration;Verbal cues required;Tactile cues required       PT Short Term Goals - 04/06/19 1640      PT SHORT TERM GOAL #1   Title  Be  independent with initial home exercise program for self-management of symptoms.    Baseline  Initial HEP provided at IE (03/14/2019);     Time  2    Period  Weeks    Status  Achieved    Target Date  03/28/19        PT Long Term Goals - 04/06/19 1641      PT LONG TERM GOAL #1   Title  Be independent with a long-term home exercise program for self-management of symptoms.     Baseline  Initial HEP provided at IE (03/14/2019); patient currently participating in HEP as presscribed but has not yet received long term HEP (04/06/2019);    Time  8    Period  Weeks    Status  Partially Met    Target Date  05/09/19      PT LONG TERM GOAL #2   Title  Demonstrate improved FOTO score by 10 units to demonstrate improvement in overall condition and self-reported functional ability.     Baseline  FOTO = 26 (03/14/2019); FOTO = 24 (04/06/2019);    Time  8    Period  Weeks    Status  On-going    Target Date  05/09/19      PT LONG TERM GOAL #3   Title  Patient will demonstrate 0 degrees AROM knee extension without pain to improve function for weight bearing activities.     Baseline  lacking 5 degrees painful (03/14/2019); lacking 5 degrees painful (04/06/2019)    Time  8    Period  Weeks    Status  On-going    Target Date  05/09/19      PT LONG TERM GOAL #4   Title  Patient will demonstrate R knee flexion strength in prone at least 4+/5 with no pain with tibia internally rotated  to improve patient's ability to complete functional activities such as stepping, swing phase of gait.     Baseline  painful 4/5 (03/14/2019); painful 4/5 (04/06/2019)    Time  8    Period  Weeks    Status  On-going    Target Date  05/09/19      PT LONG TERM GOAL #5   Title  Complete community, work and/or recreational activities without limitation due to current condition.  Baseline  difficulty walking, bending, kneeling, being active, sitting following walking, getting up and down from floor, activities requiring use of  R LE (03/14/2019, 04/06/2019);    Time  8    Period  Weeks    Status  On-going    Target Date  05/09/19            Plan - 04/20/19 1855    Clinical Impression Statement  Patient tolerated treatment well and reported a decrease of pain form 8/10 to 4/10 when not touching back of leg. He states it is 6/10 when he touches his leg. He continues to show within-session improvements but is having severe elevation of symptom between sessions. Dry needling was introduced today with reports of deep ache during muscle insertion, which is an appropriate response, good tolerance during procedure and reported decreased pain following. Subsequent exercises avoided significant load on the hamstrings to avoid any aggravating activity and focused on regional strengthening and extension based interventions at the lumbar spine due to consistent reports of worsening during long car rides. Patient was advised to add a lumbar roll in the vehicle while he is driving and to stop all hamstring exercises until next visit, only walking 20 min per day and completing 10 reps of prone press ups 6 times per day and before and after driving.  Pt required multimodal cuing for proper technique and to facilitate improved neuromuscular control, strength, range of motion, and functional ability resulting in improved performance and form. Patient would benefit from continued physical therapy to address remaining impairments and functional limitations to work towards stated goals and return to PLOF or maximal functional independence.    Personal Factors and Comorbidities  Comorbidity 3+    Comorbidities   pituitary macroadenoma (currently being successfully treated with medication, benign), hypogonadotropic hypogonadism syndrome, partial hypopituitarism, fatty liver, benign cyst of R kidney, pre-diabetes, appendectomy, biliary dyskinesia, allergic rhinitis, sensitivity to latex, obesity.     Examination-Activity Limitations   Squat;Stand;Sit;Carry;Lift;Locomotion Level;Other;Bend   general activity, getting up and down from floor, getting up and down from sitting, general multiplane motions involving R LE.    Examination-Participation Restrictions  Yard Work;Other;Community Activity;Interpersonal Relationship   participating in usual social and home activities   Stability/Clinical Decision Making  Evolving/Moderate complexity   unclear eitiology; not improving as expected   Rehab Potential  Good    PT Frequency  2x / week    PT Duration  8 weeks    PT Treatment/Interventions  ADLs/Self Care Home Management;Cryotherapy;Moist Heat;Gait training;Functional mobility training;Therapeutic activities;Therapeutic exercise;Balance training;Neuromuscular re-education;Patient/family education;Manual techniques;Passive range of motion;Dry needling;Taping;Spinal Manipulations;Joint Manipulations   joint mobilizations grades I-IV   PT Next Visit Plan  consider continuing dry needling. consider continuing prone press ups    PT Home Exercise Plan  Medbridge Access Code: [email or web address]     Consulted and Agree with Plan of Care  Patient       Patient will benefit from skilled therapeutic intervention in order to improve the following deficits and impairments:  Abnormal gait, Decreased activity tolerance, Decreased endurance, Decreased range of motion, Decreased strength, Impaired perceived functional ability, Pain, Obesity, Increased muscle spasms, Difficulty walking, Decreased mobility  Visit Diagnosis: 1. Right knee pain, unspecified chronicity   2. Difficulty in walking, not elsewhere classified   3. Stiffness of right knee, not elsewhere classified        Problem List Patient Active Problem List   Diagnosis Date Noted  . Sinusitis 07/11/2018  . Prediabetes 02/16/2018  . Arthritis 01/31/2018  .  Fatty liver 01/31/2018  . Allergic rhinitis 01/31/2018  . Pituitary macroadenoma ([place]) 01/31/2018  . Biliary dyskinesia  03/10/2017  . Pain of upper abdomen   . 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 Neck nodule 06/12/2013  . Glucose intolerance (impaired glucose tolerance) 06/08/2013  . Male hypogonadism 06/08/2013  . Hyperprolactinemia ([place]) 08/19/2011  . Invasive pituitary adenoma ([place]) 08/19/2011  . 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 Graylon Good, PT, DPT 04/20/19, 6:59 PM   [place] [facility] [address], [place], [address] Phone: [phone]   Fax:  [phone]  Name: Billyjoe Go MRN: [id number] Date of Birth: Dec 21, 1965

## 2019-04-25 ENCOUNTER — Ambulatory Visit: Payer: BC Managed Care – PPO | Admitting: Physical Therapy

## 2019-04-27 ENCOUNTER — Ambulatory Visit: Payer: BC Managed Care – PPO | Attending: Internal Medicine | Admitting: Physical Therapy

## 2019-04-27 DIAGNOSIS — M25561 Pain in right knee: Secondary | ICD-10-CM | POA: Insufficient documentation

## 2019-04-27 DIAGNOSIS — M25661 Stiffness of right knee, not elsewhere classified: Secondary | ICD-10-CM | POA: Insufficient documentation

## 2019-04-27 DIAGNOSIS — R262 Difficulty in walking, not elsewhere classified: Secondary | ICD-10-CM | POA: Insufficient documentation

## 2019-05-02 ENCOUNTER — Other Ambulatory Visit: Payer: Self-pay

## 2019-05-02 ENCOUNTER — Encounter: Payer: Self-pay | Admitting: Physical Therapy

## 2019-05-02 ENCOUNTER — Ambulatory Visit: Payer: BC Managed Care – PPO

## 2019-05-02 DIAGNOSIS — M25561 Pain in right knee: Secondary | ICD-10-CM | POA: Diagnosis present

## 2019-05-02 DIAGNOSIS — M25661 Stiffness of right knee, not elsewhere classified: Secondary | ICD-10-CM

## 2019-05-02 DIAGNOSIS — R262 Difficulty in walking, not elsewhere classified: Secondary | ICD-10-CM | POA: Diagnosis present

## 2019-05-02 NOTE — Therapy (Addendum)
[facility] [address] [place], [address] Phone: [phone]   Fax:  [phone]  Physical Therapy Treatment  Patient Details  Name: Timothy Atkins MRN: [id number] Date of Birth: May 02, 1966 Referring Provider (PT): Perrin Maltese MD   Encounter Date: 05/02/2019  PT End of Session - 05/02/19 1723    Visit Number  8    Number of Visits  16    Date for PT Re-Evaluation  05/09/19    Authorization Type  Medicaid reporting period from 03/14/2019    Authorization Time Period  Current cert period: 9/39/0300 - 05/09/2019    Authorization - Visit Number  4    Authorization - Number of Visits  12    PT Start Time  1632    PT Stop Time  1714    PT Time Calculation (min)  42 min    Activity Tolerance  Patient tolerated treatment well    Behavior During Therapy  [facility] for tasks assessed/performed       Past Medical History:  Diagnosis Date  . Arthritis   . Benign cyst of right kidney   . Constipation   . Degenerative cervical disc    mild   . Fatty liver   . Fatty liver   . Glucose intolerance (impaired glucose tolerance) 06/08/2013  . Hyperlipidemia   . Hyperprolactinemia ([place]) 08/19/2011   Overview:  05/2004 1363 ng/ml  . 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 Invasive pituitary adenoma ([place]) 08/19/2011  . Male hypogonadism 06/08/2013  . Neck nodule 06/12/2013  . 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 Pituitary tumor   . Prediabetes   . Rosacea   . RUQ pain 03/08/2017   Post RUQ that radiates Ant RUQ. 3 severe episodes over 3 weeks    Past Surgical History:  Procedure Laterality Date  . APPENDECTOMY  as child  .  TONSILLECTOMY  as child    There were no vitals filed for this visit.  Subjective Assessment - 05/02/19 1634    Subjective  Patient reported that with dry needling it seemed to help the inside of the thigh (posterior), but did not help the pain near the buttocks (gestures to ishial tuberosity). Also stated that he is now experiencing numbness in R leg that has been coming and going since dry needling.    Pertinent History  Patient is a 53 y.o. male who presents to outpatient physical therapy with a referral for medical diagnosis R knee pain. This patient's chief complaints consist of posterior and knee and hamstring pain, knee stiffness and interference with usual activities, leading to the following functional deficits: difficulty walking, bending, kneeling, being active, sitting following walking. Relevant past medical history and comorbidities include pituitary macroadenoma (currently being successfully treated with medication, benign), hypogonadotropic hypogonadism syndrome, partial hypopituitarism, fatty liver, benign cyst of R kidney, pre-diabetes, appendectomy, biliary dyskinesia, allergic rhinitis, sensitivity to latex.    Limitations  Sitting;Walking;Lifting;House hold activities;Other (comment)    Diagnostic tests  Radiograph from 01/15/2017: FINDINGS:  Mild medial femorotibial narrowing with tibial spine spurring. Small superior patellar osteophyte. No acute fracture or dislocation. Small knee effusion. No soft tissue swelling.    Patient Stated Goals  to get  better: 80-90% would be good    Currently in Pain?  Yes    Pain Score  10-Worst pain ever    Pain Location  Leg    Pain Orientation  Right;Posterior    Pain Descriptors / Indicators  Tightness    Pain Type  Chronic pain    Pain Onset  More than a month ago         TREATMENT:  **CONFIRMS SENSITIVITY TO LATEX** Denies history of spinal surgery Denies long term use of steroid medication.    Therapeutic exercise: to centralize  symptoms and improve ROM, strength, muscular endurance, and activity tolerance required for successful completion of functional activities.   - Treadmill 2.5 mph with no grade and no UE support. For improved lower extremity mobility, muscular endurance, and weightbearing activity tolerance; and to induce the analgesic effect of aerobic exercise, stimulate improved joint nutrition, and prepare body structures and systems for following interventions. : Stated that he feels like his pinky toe has gone numb with treadmill walking. Stated his foot is hurting as well, stopped at 5 minutes.  -standing back extension to attempt to centralize symptoms, pt reported no change in symptoms. Pt tends to hinge at hips and avoid lumbar/thoracic extension, mild improvement with verbal/tactile cues  -prone glute activation with feet on bolster, pt unable to initiate glute activation unilaterally, and unable to initiate activation without hamstring involvement. -prone donkey kicks (MMT position to isolate glutes) with tactile assist for form, some glute activation noted though HS attempted activation as well bilaterally -supine bridge with glute set prior to lifting hips from table x10, verbal cues for sequencing -leg discrepancy assessed. Pt presented with R lower extremity shortened compared to L. MET of R hip flexor isometric in varying degrees of hip extension/flexion, MET of L hamstring/hip extension isometric in varying degrees of hip/knee flexion. x2 rounds (3 reps with 5 second holds). 2nd round isometric hip abduction isometric and hip abduction isometric performed (5sec hold 3 reps)  Ambulation between rounds to assess pt response. Pt reported improvement first round, and then reported 4/10 in pain at end of second round.  Pt response/clinical impression: Pt with decreased ability to active glute muscles when functionally appropriate. Also noted over active hamstring muscles to compensate this weakness. The  patient would benefit from further skilled PT to encourage proper muscle activation. Leg length also assessed this session, pt demonstrated shortened R leg compared to L leg. Responded very well to MET to correct, reported pain decreased from 10/10 to 4/10 at end of session. Pt educated in MET for HEP for self management of conditioned and instructed to perform once a day, and if pain increases.     Manual therapy:  Cross friction and trigger point release to R hamstring distally and proximally. Pt reported no concordant pain with glute/piriformis palpation assessment.      HOME EXERCISE PROGRAM - Walking 20 min a day - prone press ups x 10, 6x a day and before and after traveling    PT Education - 05/02/19 1721    Education Details  leg leg isometric exercises, condition, exercise technique/form    Person(s) Educated  Patient    Methods  Explanation;Demonstration;Tactile cues;Verbal cues    Comprehension  Verbalized understanding;Returned demonstration;Verbal cues required;Tactile cues required;Need further instruction       PT Short Term Goals - 04/06/19 1640      PT SHORT TERM GOAL #1   Title  Be independent with initial home exercise program for self-management  of symptoms.    Baseline  Initial HEP provided at IE (03/14/2019);     Time  2    Period  Weeks    Status  Achieved    Target Date  03/28/19        PT Long Term Goals - 04/06/19 1641      PT LONG TERM GOAL #1   Title  Be independent with a long-term home exercise program for self-management of symptoms.     Baseline  Initial HEP provided at [facility] (03/14/2019); patient currently participating in HEP as presscribed but has not yet received long term HEP (04/06/2019);    Time  8    Period  Weeks    Status  Partially Met    Target Date  05/09/19      PT LONG TERM GOAL #2   Title  Demonstrate improved FOTO score by 10 units to demonstrate improvement in overall condition and self-reported functional ability.     Baseline   FOTO = 26 (03/14/2019); FOTO = 24 (04/06/2019);    Time  8    Period  Weeks    Status  On-going    Target Date  05/09/19      PT LONG TERM GOAL #3   Title  Patient will demonstrate 0 degrees AROM knee extension without pain to improve function for weight bearing activities.     Baseline  lacking 5 degrees painful (03/14/2019); lacking 5 degrees painful (04/06/2019)    Time  8    Period  Weeks    Status  On-going    Target Date  05/09/19      PT LONG TERM GOAL #4   Title  Patient will demonstrate R knee flexion strength in prone at least 4+/5 with no pain with tibia internally rotated  to improve patient's ability to complete functional activities such as stepping, swing phase of gait.     Baseline  painful 4/5 (03/14/2019); painful 4/5 (04/06/2019)    Time  8    Period  Weeks    Status  On-going    Target Date  05/09/19      PT LONG TERM GOAL #5   Title  Complete community, work and/or recreational activities without limitation due to current condition.     Baseline  difficulty walking, bending, kneeling, being active, sitting following walking, getting up and down from floor, activities requiring use of R LE (03/14/2019, 04/06/2019);    Time  8    Period  Weeks    Status  On-going    Target Date  05/09/19            Plan - 05/02/19 1722    Clinical Impression Statement  Pt with decreased ability to active glute muscles when functionally appropriate. Also noted over active hamstring muscles to compensate this weakness. The patient would benefit from further skilled PT to encourage proper muscle activation. Leg length also assessed this session, pt demonstrated shortened R leg compared to L leg. Responded very well to MET to correct, reported pain decreased from 10/10 to 4/10 at end of session. Pt educated in MET for HEP for self management of conditioned and instructed to perform once a day, and if pain increases.    Personal Factors and Comorbidities  Comorbidity 3+    Comorbidities    pituitary macroadenoma (currently being successfully treated with medication, benign), hypogonadotropic hypogonadism syndrome, partial hypopituitarism, fatty liver, benign cyst of R kidney, pre-diabetes, appendectomy, biliary dyskinesia, allergic rhinitis, sensitivity to latex,  obesity.     Examination-Activity Limitations  Squat;Stand;Sit;Carry;Lift;Locomotion Level;Other;Bend    Examination-Participation Restrictions  Yard Work;Other;Community Activity;Interpersonal Relationship    Stability/Clinical Decision Making  Evolving/Moderate complexity    Rehab Potential  Good    PT Frequency  2x / week    PT Duration  8 weeks    PT Treatment/Interventions  ADLs/Self Care Home Management;Cryotherapy;Moist Heat;Gait training;Functional mobility training;Therapeutic activities;Therapeutic exercise;Balance training;Neuromuscular re-education;Patient/family education;Manual techniques;Passive range of motion;Dry needling;Taping;Spinal Manipulations;Joint Manipulations    PT Next Visit Plan  consider continuing dry needling. consider continuing prone press ups    PT Home Exercise Plan  Medbridge Access Code: [email or web address]     Consulted and Agree with Plan of Care  Patient       Patient will benefit from skilled therapeutic intervention in order to improve the following deficits and impairments:  Abnormal gait, Decreased activity tolerance, Decreased endurance, Decreased range of motion, Decreased strength, Impaired perceived functional ability, Pain, Obesity, Increased muscle spasms, Difficulty walking, Decreased mobility  Visit Diagnosis: 1. Difficulty in walking, not elsewhere classified   2. Right knee pain, unspecified chronicity   3. Stiffness of right knee, not elsewhere classified        Problem List Patient Active Problem List   Diagnosis Date Noted  . Sinusitis 07/11/2018  . Prediabetes 02/16/2018  . Arthritis 01/31/2018  . Fatty liver 01/31/2018  . Allergic rhinitis 01/31/2018  .  Pituitary macroadenoma ([place]) 01/31/2018  . Biliary dyskinesia 03/10/2017  . Pain of upper abdomen   . 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 Neck nodule 06/12/2013  . Glucose intolerance (impaired glucose tolerance) 06/08/2013  . Male hypogonadism 06/08/2013  . Hyperprolactinemia ([place]) 08/19/2011  . Invasive pituitary adenoma ([place]) 08/19/2011  . 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 [place], [address] Phone: [phone]   Fax:  [phone]  Name: Erastus Bartolomei MRN: [id number] Date of Birth: 11-30-1965

## 2019-05-04 ENCOUNTER — Encounter: Payer: Self-pay | Admitting: Physical Therapy

## 2019-05-04 ENCOUNTER — Other Ambulatory Visit: Payer: Self-pay

## 2019-05-04 ENCOUNTER — Ambulatory Visit: Payer: BC Managed Care – PPO

## 2019-05-04 DIAGNOSIS — R262 Difficulty in walking, not elsewhere classified: Secondary | ICD-10-CM | POA: Diagnosis not present

## 2019-05-04 DIAGNOSIS — M25561 Pain in right knee: Secondary | ICD-10-CM

## 2019-05-04 DIAGNOSIS — M25661 Stiffness of right knee, not elsewhere classified: Secondary | ICD-10-CM

## 2019-05-04 NOTE — Therapy (Signed)
[facility] [address], [place], [address] Phone: [phone]   Fax:  [phone]  Physical Therapy Treatment  Patient Details  Name: Timothy Atkins MRN: [id number] Date of Birth: 1965-12-18 Referring Provider (PT): Perrin Maltese MD   Encounter Date: 05/04/2019  PT End of Session - 05/04/19 1637    Visit Number  9    Number of Visits  16    Date for PT Re-Evaluation  05/09/19    Authorization Type  Medicaid reporting period from 03/14/2019    Authorization Time Period  Current cert period: 05/01/8674 - 05/09/2019    Authorization - Visit Number  5    Authorization - Number of Visits  12    PT Start Time  1635    PT Stop Time  1714    PT Time Calculation (min)  39 min    Equipment Utilized During Treatment  Gait belt    Activity Tolerance  Patient tolerated treatment well    Behavior During Therapy  [facility] for tasks assessed/performed       Past Medical History:  Diagnosis Date  . Arthritis   . Benign cyst of right kidney   . Constipation   . Degenerative cervical disc    mild   . Fatty liver   . Fatty liver   . Glucose intolerance (impaired glucose tolerance) 06/08/2013  . Hyperlipidemia   . Hyperprolactinemia ([place]) 08/19/2011   Overview:  05/2004 1363 ng/ml  . 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 Invasive pituitary adenoma ([place]) 08/19/2011  . Male hypogonadism 06/08/2013  . Neck nodule 06/12/2013  . 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 Pituitary tumor   . Prediabetes   . Rosacea   . RUQ pain 03/08/2017   Post RUQ that radiates Ant RUQ. 3 severe episodes over 3 weeks    Past Surgical History:  Procedure  Laterality Date  . APPENDECTOMY  as child  . TONSILLECTOMY  as child    There were no vitals filed for this visit.  Subjective Assessment - 05/04/19 1634    Subjective  Patient reported that he is doing better today, that his pain is about the same compared to when he left therapy. He is being compliant with his new HEP that includes MET.    Pertinent History  Patient is a 53 y.o. male who presents to outpatient physical therapy with a referral for medical diagnosis R knee pain. This patient's chief complaints consist of posterior and knee and hamstring pain, knee stiffness and interference with usual activities, leading to the following functional deficits: difficulty walking, bending, kneeling, being active, sitting following walking. Relevant past medical history and comorbidities include pituitary macroadenoma (currently being successfully treated with medication, benign), hypogonadotropic hypogonadism syndrome, partial hypopituitarism, fatty liver, benign cyst of R kidney, pre-diabetes, appendectomy, biliary dyskinesia, allergic rhinitis, sensitivity to latex.    Limitations  Sitting;Walking;Lifting;House hold activities;Other (comment)    Diagnostic tests  Radiograph from 01/15/2017: FINDINGS:  Mild medial femorotibial narrowing with tibial spine spurring. Small superior patellar osteophyte. No acute fracture or dislocation. Small knee effusion. No soft tissue swelling.    Patient Stated Goals  to get better: 80-90% would be good  Currently in Pain?  Yes    Pain Score  4     Pain Location  Leg    Pain Orientation  Right;Proximal;Lateral    Pain Descriptors / Indicators  Dull;Aching    Pain Type  Chronic pain    Pain Onset  More than a month ago      TREATMENT:  **CONFIRMS SENSITIVITY TO LATEX** Denies history of spinal surgery Denies long term use of steroid medication.    Therapeutic exercise: to centralize symptoms and improve ROM, strength, muscular endurance, and activity  tolerance required for successful completion of functional activities.    - Treadmill 2.5 mph with no grade and no UE support. For improved lower extremity mobility, muscular endurance, and weightbearing activity tolerance; and to induce the analgesic effect of aerobic exercise, stimulate improved joint nutrition, and prepare body structures and systems for following interventions.    -leg discrepancy re-assessed. Significant improvement compared to previous session, mild discrepancy this session. Pt presented with R lower extremity shortened compared to L. - MET of R hip flexor isometric in varying degrees of hip extension/flexion, MET of L hamstring/hip extension isometric in varying degrees of hip/knee flexion. 1 round with PT facilitation(3 reps with 5 second holds). Isometric hip abduction isometric and hip abduction isometric performed, x10 hip abduction with RTB, hip adduction performed with pillow squeezes x10 reps  -Second round of MET pt set up to improve independence. L foot on wall, pt supine on plinth for hamstring isometric in varying degrees of hip and knee flexion. For R hip flexor MET, pt seated in chair and pushing leg into table (beneath table). PT assist for isometric hip abduction/adduction (5 sec holds x3 ea)              Ambulation between rounds to assess pt response. Stated that it felt improved, looser but unwilling to rate on a numerical scale.  -PT and pt also discussed self soft tissue mobilization with lacrosse ball sitting in a chair, pt reported he has a lacrosse ball at home, demo and performed ~52mnutes. - Isometric glute set attempted in prone with knee resting on table (slightly bent) and with quad set prior to glute set, with minimal glute activation noted, x10   -Isometric glute set in prone with bent knees and heels pressing together, much improved glute activation 2x10  -Isometric glute set in prone legs relaxed x10, much improved glute activation noted this  round.  -Bridges in supine with heels together with glute set prior to lifting hips from table x10  Bridges with feet apart with glute set prior to lifting hips from table x10   Pt response/clinical impression: Pt with decreased leg length discrepancy this visit compared to previous. PT also attempted to provide shoe lift to assess pt response but ineffective (no change in pain and patient felt asymmetrical). Session also focused on promoting glute activation/motor control to decrease load on bilateral hamstrings and to improve functional abilities (stairs, squatting, gait). The patient demonstrated moderate improvement during session and HEP updated to encourage learning. The patient would benefit from further skilled PT to continued education and to progress towards goals.        HOME EXERCISE PROGRAM - Walking 20 min a day - prone press ups x 10, 6x a day and before and after traveling -isometrics MET for pelvic alignment -prone glute isometric set with heels pressed together (knees bent)      PT Education - 05/04/19 1636    Education Details  exercise  form/technique, condition    Person(s) Educated  Patient    Methods  Explanation;Demonstration;Tactile cues    Comprehension  Verbalized understanding;Returned demonstration;Verbal cues required       PT Short Term Goals - 04/06/19 1640      PT SHORT TERM GOAL #1   Title  Be independent with initial home exercise program for self-management of symptoms.    Baseline  Initial HEP provided at IE (03/14/2019);     Time  2    Period  Weeks    Status  Achieved    Target Date  03/28/19        PT Long Term Goals - 04/06/19 1641      PT LONG TERM GOAL #1   Title  Be independent with a long-term home exercise program for self-management of symptoms.     Baseline  Initial HEP provided at IE (03/14/2019); patient currently participating in HEP as presscribed but has not yet received long term HEP (04/06/2019);    Time  8    Period   Weeks    Status  Partially Met    Target Date  05/09/19      PT LONG TERM GOAL #2   Title  Demonstrate improved FOTO score by 10 units to demonstrate improvement in overall condition and self-reported functional ability.     Baseline  FOTO = 26 (03/14/2019); FOTO = 24 (04/06/2019);    Time  8    Period  Weeks    Status  On-going    Target Date  05/09/19      PT LONG TERM GOAL #3   Title  Patient will demonstrate 0 degrees AROM knee extension without pain to improve function for weight bearing activities.     Baseline  lacking 5 degrees painful (03/14/2019); lacking 5 degrees painful (04/06/2019)    Time  8    Period  Weeks    Status  On-going    Target Date  05/09/19      PT LONG TERM GOAL #4   Title  Patient will demonstrate R knee flexion strength in prone at least 4+/5 with no pain with tibia internally rotated  to improve patient's ability to complete functional activities such as stepping, swing phase of gait.     Baseline  painful 4/5 (03/14/2019); painful 4/5 (04/06/2019)    Time  8    Period  Weeks    Status  On-going    Target Date  05/09/19      PT LONG TERM GOAL #5   Title  Complete community, work and/or recreational activities without limitation due to current condition.     Baseline  difficulty walking, bending, kneeling, being active, sitting following walking, getting up and down from floor, activities requiring use of R LE (03/14/2019, 04/06/2019);    Time  8    Period  Weeks    Status  On-going    Target Date  05/09/19            Plan - 05/04/19 1722    Clinical Impression Statement  Pt with decreased leg length discrepancy this visit compared to previous. PT also attempted to provide shoe lift to assess pt response but ineffective (no change in pain and patient felt asymmetrical). Session also focused on promoting glute activation/motor control to decrease load on bilateral hamstrings and to improve functional abilities (stairs, squatting, gait). The patient  demonstrated moderate improvement during session and HEP updated to encourage learning. The patient would benefit from further  skilled PT to continued education and to progress towards goals.    Comorbidities   pituitary macroadenoma (currently being successfully treated with medication, benign), hypogonadotropic hypogonadism syndrome, partial hypopituitarism, fatty liver, benign cyst of R kidney, pre-diabetes, appendectomy, biliary dyskinesia, allergic rhinitis, sensitivity to latex, obesity.     Examination-Activity Limitations  Squat;Stand;Sit;Carry;Lift;Locomotion Level;Other;Bend    Examination-Participation Restrictions  Yard Work;Other;Community Activity;Interpersonal Relationship    Rehab Potential  Good    PT Frequency  2x / week    PT Duration  8 weeks    PT Treatment/Interventions  ADLs/Self Care Home Management;Cryotherapy;Moist Heat;Gait training;Functional mobility training;Therapeutic activities;Therapeutic exercise;Balance training;Neuromuscular re-education;Patient/family education;Manual techniques;Passive range of motion;Dry needling;Taping;Spinal Manipulations;Joint Manipulations    PT Next Visit Plan  consider continuing dry needling. consider continuing prone press ups    PT Home Exercise Plan  Medbridge Access Code: [email or web address]     Consulted and Agree with Plan of Care  Patient       Patient will benefit from skilled therapeutic intervention in order to improve the following deficits and impairments:  Abnormal gait, Decreased activity tolerance, Decreased endurance, Decreased range of motion, Decreased strength, Impaired perceived functional ability, Pain, Obesity, Increased muscle spasms, Difficulty walking, Decreased mobility  Visit Diagnosis: 1. Difficulty in walking, not elsewhere classified   2. Right knee pain, unspecified chronicity   3. Stiffness of right knee, not elsewhere classified        Problem List Patient Active Problem List   Diagnosis Date Noted  .  Sinusitis 07/11/2018  . Prediabetes 02/16/2018  . Arthritis 01/31/2018  . Fatty liver 01/31/2018  . Allergic rhinitis 01/31/2018  . Pituitary macroadenoma ([place]) 01/31/2018  . Biliary dyskinesia 03/10/2017  . Pain of upper abdomen   . 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 Neck nodule 06/12/2013  . Glucose intolerance (impaired glucose tolerance) 06/08/2013  . Male hypogonadism 06/08/2013  . Hyperprolactinemia ([place]) 08/19/2011  . Invasive pituitary adenoma ([place]) 08/19/2011  . 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, [place], [address] Phone: [phone]   Fax:  [phone]  Name: Timothy Atkins MRN: [id number] Date of Birth: 02/15/1966

## 2019-05-09 ENCOUNTER — Ambulatory Visit: Payer: BC Managed Care – PPO | Admitting: Physical Therapy

## 2019-05-09 ENCOUNTER — Encounter: Payer: Self-pay | Admitting: Physical Therapy

## 2019-05-09 ENCOUNTER — Other Ambulatory Visit: Payer: Self-pay

## 2019-05-09 DIAGNOSIS — R262 Difficulty in walking, not elsewhere classified: Secondary | ICD-10-CM | POA: Diagnosis not present

## 2019-05-09 DIAGNOSIS — M25561 Pain in right knee: Secondary | ICD-10-CM

## 2019-05-09 DIAGNOSIS — M25661 Stiffness of right knee, not elsewhere classified: Secondary | ICD-10-CM

## 2019-05-09 NOTE — Therapy (Signed)
[facility] [address], [place], [address] Phone: [phone]   Fax:  [phone]  Physical Therapy Treatment / Re-Certification / Progress Note Reporting period: 03/14/2019 - 05/09/2019  Patient Details  Name: Timothy Atkins MRN: [id number] Date of Birth: 02-08-1966 Referring Provider (PT): Perrin Maltese MD   Encounter Date: 05/09/2019  PT End of Session - 05/09/19 1637    Visit Number  10    Number of Visits  16    Date for PT Re-Evaluation  06/20/19    Authorization Type  Medicaid reporting period from 03/14/2019 (new reporting period from 05/09/2019);    Authorization Time Period  Current cert period: 1/49/7026 - 06/20/2019 (latest PN: 05/09/2019);    Authorization - Visit Number  6    Authorization - Number of Visits  12    PT Start Time  1632    PT Stop Time  3785    PT Time Calculation (min)  43 min    Equipment Utilized During Treatment  Gait belt    Activity Tolerance  Patient tolerated treatment well    Behavior During Therapy  WFL for tasks assessed/performed       Past Medical History:  Diagnosis Date  . Arthritis   . Benign cyst of right kidney   . Constipation   . Degenerative cervical disc    mild   . Fatty liver   . Fatty liver   . Glucose intolerance (impaired glucose tolerance) 06/08/2013  . Hyperlipidemia   . Hyperprolactinemia ([place]) 08/19/2011   Overview:  05/2004 1363 ng/ml  . Hypogonadotropic hypogonadism syndrome, male ([place] [facility]) 06/13/2013  . Invasive pituitary adenoma ([place]) 08/19/2011  . Male hypogonadism 06/08/2013  . Neck nodule 06/12/2013  . 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 Pituitary tumor   . Prediabetes   .  Rosacea   . RUQ pain 03/08/2017   Post RUQ that radiates Ant RUQ. 3 severe episodes over 3 weeks    Past Surgical History:  Procedure Laterality Date  . APPENDECTOMY  as child  . TONSILLECTOMY  as child    There were no vitals filed for this visit.      Subjective Assessment - 05/09/19 1640    Subjective  Patinet reports he is feeling well today with 6/10 pain in the ischial tuberosity to medial knee that is intermittant. He reports feeling good after last treatment session. He is performing HEP as prescribed. Patient reports he feels that the dry needling helped by losening up his adductor muscles and the past two physical therapy sessions have also helped and he is starting to feel relief. He feels about 40% better than when he started PT.    Pertinent History  Patient is a 53 y.o. male who presents to outpatient physical therapy with a referral for medical diagnosis R knee pain. This patient's chief complaints consist of posterior and knee and hamstring pain, knee stiffness and interference with usual activities, leading to the following functional deficits: difficulty walking, bending, kneeling, being active, sitting following walking. Relevant past medical history and comorbidities include pituitary macroadenoma (currently being successfully treated with medication, benign), hypogonadotropic hypogonadism syndrome, partial hypopituitarism, fatty liver, benign cyst of R kidney, pre-diabetes, appendectomy, biliary  dyskinesia, allergic rhinitis, sensitivity to latex.    Limitations  Sitting;Walking;Lifting;House hold activities;Other (comment)    Diagnostic tests  Radiograph from 01/15/2017: FINDINGS:  Mild medial femorotibial narrowing with tibial spine spurring. Small superior patellar osteophyte. No acute fracture or dislocation. Small knee effusion. No soft tissue swelling.    Patient Stated Goals  to get better: 80-90% would be good    Currently in Pain?  Yes    Pain Score  6     Pain  Location  Leg    Pain Orientation  Right    Pain Descriptors / Indicators  Aching;Dull    Pain Type  Chronic pain    Pain Radiating Towards  R ischial tuberosity to R calf. has had interimttant paresthesia to foot previously.    Pain Onset  More than a month ago    Pain Frequency  Intermittent    Aggravating Factors   Patinet reports similar agg factors but less.    Effect of Pain on Daily Activities  difficulty walking, bending, kneeling, being activie, sitting following walking, getting up and down from floor, activities requiring use of R LE, driving. but improving.        [facility] PT Assessment - 05/09/19 0001      Assessment   Medical Diagnosis  R knee pain    Referring Provider (PT)  Perrin Maltese MD    Onset Date/Surgical Date  01/12/19    Hand Dominance  Left   some things done with R   Next MD Visit  Maybe in August    Prior Therapy  currently in PT, none previously      Precautions   Precautions  None      Restrictions   Weight Bearing Restrictions  No      Home Environment   Living Environment  --   no concerns about home     Prior Function   Level of Independence  Independent    Vocation  Full time employment    Vocation Requirements  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       Cognition   Overall Cognitive Status  Within Functional Limits for tasks assessed      Observation/Other Assessments   Observations  see note from 05/09/2019 for latest objective data    Focus on Therapeutic Outcomes (FOTO)   FOTO = 22 (05/09/2019):        OBJECTIVE: OBSERVATION/INSPECTION: Patient presents with obesity, normal muscle mass.  R functional leg length discrepancy assessed in supine and found to have slight R leg shorter than left (~1/4 inch) prior to treatment.  NEUROLOGICAL: Dermatomes: BLE equal and intact to light touch.   PERIPHERAL  JOINT MOTION (AROM/PROM in degrees):  *Indicates pain Knee  Flexion: R = 150 tissue approximation painful at hamstring, L = 150 (tissue approximation)..   Extension: R = lacking 5/lacking 3 tender at anterior knee, L = 0.  STRENGTH:  *Indicates pain Hip         Extension (knees flexed): R = 4+/5, L = 4+/5. Knee  Eccentric flexion in prone 4+/5: Middle R = mild pain, Tibial medially rotated = 6/10 pain,   EDUCATION/COGNITION: Patient is alert and oriented X 4.  Objective measurements completed on examination: See above findings.     TREATMENT: **CONFIRMS SENSITIVITY TO LATEX** Denies history of  spinal surgery Denies long term use of steroid medication.  Therapeutic exercise:to centralize symptoms and improve ROM, strength, muscular endurance, and activity tolerance required for successful completion of functional activities. Carole Civil.22mh with nograde and no UE support.For improved lower extremity mobility, muscular endurance, and weightbearing activity tolerance; and to induce the analgesic effect of aerobic exercise, stimulate improved joint nutrition, and prepare body structures and systems for following interventions.  -leg discrepancy re-assessed. mild discrepancy this session near 1/4 inch. Pt presented with R lower extremity shortened compared to L. - examination of ROM and strength to assess progress (see above). - MET of isometric R hip flexion in varying degrees of hip extension/flexion, MET of isometric L hamstring/hip extension in varying degrees of hip/knee flexion. 2 rounds with PT facilitation(3 reps with 5 second holds at each angle).  Isometric hip abduction at three angles (3 x 10 second holds at each angle with strap around knees) and isometric hip abduction performed (x10 seconds); 2 rounds facilitated by clinician. Checked for improvement between rounds with improvement noted per observation and reported by patient. Ambulation between rounds to  assess pt response. Stated that it felt improved, looser noted 4/10 pain by end of session. -PT and pt also discussed self soft tissue mobilization with lacrosse ball sitting in a chair, pt reported he had not yet tried.  - Prone hip extension with knee flexed to preferentially bias gluteus maximus. x10 each side and glute squeeze prior. Cuing to keep knee flexed. Pt with palpable glute activation bilaterally.    HOME EXERCISE PROGRAM - Walking 20 min a day - prone press ups x 10, 6x a day and before and after traveling -isometrics MET for pelvic alignment -prone glute isometric set with heels pressed together (knees bent)     Patient response to treatment:  Pt tolerated treatment well and reported overall reduction in pain to 4/10 by end of session. He reported feeling more balanced after MET techniques and demonstrated improved glute activation. He demonstrated good understanding of HEP. Pt required multimodal cuing for proper technique and to facilitate improved neuromuscular control, strength, range of motion, and functional ability resulting in improved performance and form.      PT Education - 05/09/19 1907    Education Details  Exercise purpose/form. Self management techniques. Education on diagnosis, prognosis, POC, anatomy and physiology of current condition    Person(s) Educated  Patient    Methods  Explanation;Demonstration;Tactile cues;Verbal cues    Comprehension  Verbalized understanding;Returned demonstration;Verbal cues required;Tactile cues required       PT Short Term Goals - 05/09/19 1930      PT SHORT TERM GOAL #1   Title  Be independent with initial home exercise program for self-management of symptoms.    Baseline  Initial HEP provided at IE (03/14/2019);     Time  2    Period  Weeks    Status  Achieved    Target Date  03/28/19        PT Long Term Goals - 05/09/19 1652      PT LONG TERM GOAL #1   Title  Be independent with a long-term home exercise  program for self-management of symptoms.     Baseline  Initial HEP provided at IE (03/14/2019); patient currently participating in HEP as presscribed but has not yet received long term HEP (04/06/2019; 05/09/2019);    Time  6    Period  Weeks    Status  Partially Met    Target Date  06/20/19  PT LONG TERM GOAL #2   Title  Demonstrate improved FOTO score by 10 units to demonstrate improvement in overall condition and self-reported functional ability.     Baseline  FOTO = 26 (03/14/2019); FOTO = 24 (04/06/2019);  FOTO = 22 (05/09/2019):\;    Time  6    Period  Weeks    Status  On-going    Target Date  06/20/19      PT LONG TERM GOAL #3   Title  Patient will demonstrate 0 degrees AROM knee extension without pain to improve function for weight bearing activities.     Baseline  lacking 5 degrees painful (03/14/2019); lacking 5 degrees painful (04/06/2019); lacking 5 degrees mildly painful ( 05/09/2019)    Time  6    Period  Weeks    Status  On-going    Target Date  06/20/19      PT LONG TERM GOAL #4   Title  Patient will demonstrate R knee flexion strength in prone at least 4+/5 with no pain with tibia internally rotated  to improve patient's ability to complete functional activities such as stepping, swing phase of gait.     Baseline  painful 4/5 (03/14/2019); painful 4/5 (04/06/2019); painful (6/10)  4+/5 (05/09/2019);    Time  6    Period  Weeks    Status  On-going    Target Date  06/20/19      PT LONG TERM GOAL #5   Title  Complete community, work and/or recreational activities without limitation due to current condition.     Baseline  difficulty walking, bending, kneeling, being active, sitting following walking, getting up and down from floor, activities requiring use of R LE (03/14/2019, 04/06/2019); improving feels 40% better per pt report (05/09/2019);    Time  6    Period  Weeks    Status  Partially Met    Target Date  06/20/19            Plan - 05/09/19 1928    Clinical  Impression Statement  Patient has attended 10 skilled physical therapy treatment sessions this episode of care and overall continues to make progress towards stated goals. Patient is a 53 y.o. male referred to outpatient physical therapy with a medical diagnosis of R knee pain who presents with signs and symptoms consistent with ongoing R hamstring, knee and proximal calf pain. Treatment initially focused on confirming and addressing differential diagnosis of referral from back or chronic hamstring tendinopathy but did not make significant progress prompting trials of several treatment approaches including dry needling which patient states relieved some tightness, but also resulted in increased reports of paresthesia. Over the last three visits patient was found to have functional leg length discrepancy, R shorter than L, and is now responding well to muscle energy techniques (METs) to address possible R posterior innominate rotation. He also was found to have underactive glute activation during functional activities that may be causing overuse of hamstrings and has responded well to exercises promoting increased glute activation. Although patient's condition was resistant to improvement initially he has started demonstrating improvement in pain, activity tolerance, strength, and neuromuscular control, and ss improving understanding of self-management techniques. Patient continues to present with with significant pain, stiffness, motor control, weakness, ROM, and activity tolerance impairments that are limiting ability to complete his usual activities such as walking, bending, twisting, floor and chair transfers, driving, working, and being generally active without without difficulty. Patient will benefit from continued skilled physical therapy intervention  to address current body structure impairments and activity limitations to improve function and work towards goals set in current POC in order to return to prior  level of function or maximal functional improvement.    Comorbidities   pituitary macroadenoma (currently being successfully treated with medication, benign), hypogonadotropic hypogonadism syndrome, partial hypopituitarism, fatty liver, benign cyst of R kidney, pre-diabetes, appendectomy, biliary dyskinesia, allergic rhinitis, sensitivity to latex, obesity.     Examination-Activity Limitations  Squat;Stand;Sit;Carry;Lift;Locomotion Level;Other;Bend    Examination-Participation Restrictions  Yard Work;Other;Community Activity;Interpersonal Relationship    Rehab Potential  Good    PT Frequency  2x / week    PT Duration  6 weeks    PT Treatment/Interventions  ADLs/Self Care Home Management;Cryotherapy;Moist Heat;Gait training;Functional mobility training;Therapeutic activities;Therapeutic exercise;Balance training;Neuromuscular re-education;Patient/family education;Manual techniques;Passive range of motion;Dry needling;Taping;Spinal Manipulations;Joint Manipulations    PT Next Visit Plan  continue with METs, glute activation exercises progressing to functional activities as tolerated    PT Home Exercise Plan  Medbridge Access Code: [email or web address]     Consulted and Agree with Plan of Care  Patient       Patient will benefit from skilled therapeutic intervention in order to improve the following deficits and impairments:  Abnormal gait, Decreased activity tolerance, Decreased endurance, Decreased range of motion, Decreased strength, Impaired perceived functional ability, Pain, Obesity, Increased muscle spasms, Difficulty walking, Decreased mobility  Visit Diagnosis: 1. Difficulty in walking, not elsewhere classified   2. Right knee pain, unspecified chronicity   3. Stiffness of right knee, not elsewhere classified        Problem List Patient Active Problem List   Diagnosis Date Noted  . Sinusitis 07/11/2018  . Prediabetes 02/16/2018  . Arthritis 01/31/2018  . Fatty liver 01/31/2018  . Allergic  rhinitis 01/31/2018  . Pituitary macroadenoma ([place]) 01/31/2018  . Biliary dyskinesia 03/10/2017  . Pain of upper abdomen   . Hypogonadotropic hypogonadism syndrome, male ([place]) 06/13/2013  . Neck nodule 06/12/2013  . Glucose intolerance (impaired glucose tolerance) 06/08/2013  . Male hypogonadism 06/08/2013  . Hyperprolactinemia ([place]) 08/19/2011  . Invasive pituitary adenoma ([place]) 08/19/2011  . 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 Graylon Good, PT, DPT 05/09/19, 7:35 PM  [facility] [address], [place], [address] Phone: [phone]   Fax:  [phone]  Name: Timothy Atkins MRN: [id number] Date of Birth: 08-16-1966

## 2019-05-11 ENCOUNTER — Ambulatory Visit: Payer: BC Managed Care – PPO | Admitting: Physical Therapy

## 2019-05-16 ENCOUNTER — Ambulatory Visit: Payer: BC Managed Care – PPO | Admitting: Physical Therapy

## 2019-05-17 ENCOUNTER — Ambulatory Visit: Payer: BC Managed Care – PPO | Admitting: Physical Therapy

## 2019-05-18 ENCOUNTER — Ambulatory Visit: Payer: BC Managed Care – PPO | Admitting: Physical Therapy

## 2019-05-23 ENCOUNTER — Other Ambulatory Visit: Payer: Self-pay

## 2019-05-23 ENCOUNTER — Ambulatory Visit: Payer: BC Managed Care – PPO | Admitting: Physical Therapy

## 2019-05-23 DIAGNOSIS — M25661 Stiffness of right knee, not elsewhere classified: Secondary | ICD-10-CM

## 2019-05-23 DIAGNOSIS — M25561 Pain in right knee: Secondary | ICD-10-CM

## 2019-05-23 DIAGNOSIS — R262 Difficulty in walking, not elsewhere classified: Secondary | ICD-10-CM

## 2019-05-23 NOTE — Therapy (Signed)
[facility] [address] [place], [address] Phone: [phone]   Fax:  [phone]  Physical Therapy Treatment  Patient Details  Name: Timothy Atkins MRN: [id number] Date of Birth: April 22, 1966 Referring Provider (PT): Perrin Maltese MD   Encounter Date: 05/23/2019  PT End of Session - 05/23/19 1747    Visit Number  11    Number of Visits  16    Date for PT Re-Evaluation  06/20/19    Authorization Type  Medicaid reporting period from 03/14/2019 (new reporting period from 05/09/2019);    Authorization Time Period  Current cert period: 2/29/7989 - 06/20/2019 (latest PN: 05/09/2019);    Authorization - Visit Number  7    Authorization - Number of Visits  12    PT Start Time  2119    PT Stop Time  1640    PT Time Calculation (min)  36 min    Equipment Utilized During Treatment  --    Activity Tolerance  Patient tolerated treatment well    Behavior During Therapy  WFL for tasks assessed/performed       Past Medical History:  Diagnosis Date  . Arthritis   . Benign cyst of right kidney   . Constipation   . Degenerative cervical disc    mild   . Fatty liver   . Fatty liver   . Glucose intolerance (impaired glucose tolerance) 06/08/2013  . Hyperlipidemia   . Hyperprolactinemia ([place]) 08/19/2011   Overview:  05/2004 1363 ng/ml  . 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 Invasive pituitary adenoma ([place]) 08/19/2011  . Male hypogonadism 06/08/2013  . Neck nodule 06/12/2013  . 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 Pituitary tumor   . Prediabetes   . Rosacea   . RUQ pain 03/08/2017   Post RUQ that radiates Ant RUQ. 3 severe episodes  over 3 weeks    Past Surgical History:  Procedure Laterality Date  . APPENDECTOMY  as child  . TONSILLECTOMY  as child    There were no vitals filed for this visit.  Subjective Assessment - 05/23/19 1634    Subjective  Patient reports he is feeling well upon arrival and rates his pain as 4-5/10 radiating from the right ischial tuberosity. He had a rough day yesterday with increased pain up to 10/10 and unable to drive and feeling like he had to drag his leg out. It feels like it takes a minute or two to walk. He reports feeling better following last treatment session. States yesterday is the first day he had pain that high since his last treatment session. Feels his HEP is going well.    Pertinent History  Patient is a 53 y.o. male who presents to outpatient physical therapy with a referral for medical diagnosis R knee pain. This patient's chief complaints consist of posterior and knee and hamstring pain, knee stiffness and interference with usual activities, leading to the following functional deficits: difficulty walking, bending, kneeling, being active, sitting following walking. Relevant past medical history and comorbidities include pituitary macroadenoma (currently being successfully treated with medication, benign), hypogonadotropic hypogonadism syndrome, partial hypopituitarism, fatty liver, benign cyst of R kidney, pre-diabetes, appendectomy, biliary dyskinesia, allergic rhinitis, sensitivity  to latex.    Limitations  Sitting;Walking;Lifting;House hold activities;Other (comment)    Diagnostic tests  Radiograph from 01/15/2017: FINDINGS:  Mild medial femorotibial narrowing with tibial spine spurring. Small superior patellar osteophyte. No acute fracture or dislocation. Small knee effusion. No soft tissue swelling.    Patient Stated Goals  to get better: 80-90% would be good    Currently in Pain?  Yes    Pain Score  5     Pain Location  Leg    Pain Orientation  Right   radiating from  ischial tuberosity region   Pain Descriptors / Indicators  Aching;Dull    Pain Radiating Towards  R ischial tuberosity to R calf. has had intermittant paresthesia to foot previously.    Pain Onset  More than a month ago        TREATMENT: **CONFIRMS SENSITIVITY TO LATEX** Denies history of spinal surgery Denies long term use of steroid medication.  Therapeutic exercise:to centralize symptoms and improve ROM, strength, muscular endurance, and activity tolerance required for successful completion of functional activities. Carole Civil.43mh with nograde and no UE support.For improved lower extremity mobility, muscular endurance, and weightbearing activity tolerance; and to induce the analgesic effect of aerobic exercise, stimulate improved joint nutrition, and prepare body structures and systems for following interventions.  -leg discrepancyre-assessed. mild discrepancy this session near 1/4 inch.Pt presented with R lower extremity shortened compared to L. Improved after MET. -MET of isometric R hip flexion in varying degrees of hip extension/flexion, MET of isometric L hamstring/hip extension in varying degrees of hip/knee flexion. 2roundswith PT facilitation  (3 reps with 5 second holds at each angle).  Isometric hip abduction at three angles (3 x 10 second holds at each angle with strap around knees) and isometric hip abduction performed (x10 seconds); 1 round facilitated by clinician. Checked for improvement between rounds with improvement noted per observation and reported by patient. Ambulation between rounds to assess pt response.Stated that it felt improved, looser noted 4/10 pain by end of session.  Modalities: to decrease pain and tension Dry Needling: (1) .25 x 613mneedles placed along the proximal attachment of the R hamstring along the ischial tuberosity  with patient positioned in prone. Patient verbally consents to treatment, educated on risks and benefits of treatment.  Dry needling performed by WeUvaldo RisingPT, DPT and certified in trigger point dry needling.    HOME EXERCISE PROGRAM - Walking 20 min a day - prone press ups x 10, 6x a day and before and after traveling -isometrics MET for pelvic alignment -prone glute isometric set with heels pressed together (knees bent)    PT Education - 05/23/19 1720    Education Details  exercise form/technique, condition    Person(s) Educated  Patient    Methods  Explanation;Demonstration;Tactile cues;Verbal cues    Comprehension  Verbalized understanding;Returned demonstration;Verbal cues required       PT Short Term Goals - 05/09/19 1930      PT SHORT TERM GOAL #1   Title  Be independent with initial home exercise program for self-management of symptoms.    Baseline  Initial HEP provided at IE (03/14/2019);     Time  2    Period  Weeks    Status  Achieved    Target Date  03/28/19        PT Long Term Goals - 05/09/19 1652      PT LONG TERM GOAL #1   Title  Be independent with a long-term home exercise program for  self-management of symptoms.     Baseline  Initial HEP provided at IE (03/14/2019); patient currently participating in HEP as presscribed but has not yet received long term HEP (04/06/2019; 05/09/2019);    Time  6    Period  Weeks    Status  Partially Met    Target Date  06/20/19      PT LONG TERM GOAL #2   Title  Demonstrate improved FOTO score by 10 units to demonstrate improvement in overall condition and self-reported functional ability.     Baseline  FOTO = 26 (03/14/2019); FOTO = 24 (04/06/2019);  FOTO = 22 (05/09/2019):\;    Time  6    Period  Weeks    Status  On-going    Target Date  06/20/19      PT LONG TERM GOAL #3   Title  Patient will demonstrate 0 degrees AROM knee extension without pain to improve function for weight bearing activities.     Baseline  lacking 5 degrees painful (03/14/2019); lacking 5 degrees painful (04/06/2019); lacking 5 degrees mildly painful (  05/09/2019)    Time  6    Period  Weeks    Status  On-going    Target Date  06/20/19      PT LONG TERM GOAL #4   Title  Patient will demonstrate R knee flexion strength in prone at least 4+/5 with no pain with tibia internally rotated  to improve patient's ability to complete functional activities such as stepping, swing phase of gait.     Baseline  painful 4/5 (03/14/2019); painful 4/5 (04/06/2019); painful (6/10)  4+/5 (05/09/2019);    Time  6    Period  Weeks    Status  On-going    Target Date  06/20/19      PT LONG TERM GOAL #5   Title  Complete community, work and/or recreational activities without limitation due to current condition.     Baseline  difficulty walking, bending, kneeling, being active, sitting following walking, getting up and down from floor, activities requiring use of R LE (03/14/2019, 04/06/2019); improving feels 40% better per pt report (05/09/2019);    Time  6    Period  Weeks    Status  Partially Met    Target Date  06/20/19            Plan - 05/23/19 1720    Clinical Impression Statement  Pt tolerated treatment well and reported overall reduction in pain from 5/10 to 2/10 with first round of METs. Patient reported he did not feel needle at all until it hit the tight muscle. PT reported significant twitch and patient felt significant relief and felt he had achieved his goals for the session following dry needling but unable to provide a numeric rating. States that tingling is abolished when this region relaxes and that the needle addressed the exact point that seems to be the source of pain. Patient continues to lack complete carry over in improvement from one session to the next, although this is improving, and his condition has not shown long term stability in improvement. Patient would benefit from continued physical therapy to address remaining impairments and functional limitations to work towards stated goals and return to PLOF or maximal functional  independence.    Comorbidities   pituitary macroadenoma (currently being successfully treated with medication, benign), hypogonadotropic hypogonadism syndrome, partial hypopituitarism, fatty liver, benign cyst of R kidney, pre-diabetes, appendectomy, biliary dyskinesia, allergic rhinitis, sensitivity to latex, obesity.  Examination-Activity Limitations  Squat;Stand;Sit;Carry;Lift;Locomotion Level;Other;Bend    Examination-Participation Restrictions  Yard Work;Other;Community Activity;Interpersonal Relationship    Rehab Potential  Good    PT Frequency  2x / week    PT Duration  6 weeks    PT Treatment/Interventions  ADLs/Self Care Home Management;Cryotherapy;Moist Heat;Gait training;Functional mobility training;Therapeutic activities;Therapeutic exercise;Balance training;Neuromuscular re-education;Patient/family education;Manual techniques;Passive range of motion;Dry needling;Taping;Spinal Manipulations;Joint Manipulations    PT Next Visit Plan  continue with METs, glute activation exercises progressing to functional activities as tolerated    PT Home Exercise Plan  Medbridge Access Code: [email or web address]     Consulted and Agree with Plan of Care  Patient       Patient will benefit from skilled therapeutic intervention in order to improve the following deficits and impairments:  Abnormal gait, Decreased activity tolerance, Decreased endurance, Decreased range of motion, Decreased strength, Impaired perceived functional ability, Pain, Obesity, Increased muscle spasms, Difficulty walking, Decreased mobility  Visit Diagnosis: 1. Difficulty in walking, not elsewhere classified   2. Right knee pain, unspecified chronicity   3. Stiffness of right knee, not elsewhere classified        Problem List Patient Active Problem List   Diagnosis Date Noted  . Sinusitis 07/11/2018  . Prediabetes 02/16/2018  . Arthritis 01/31/2018  . Fatty liver 01/31/2018  . Allergic rhinitis 01/31/2018  . Pituitary  macroadenoma ([place]) 01/31/2018  . Biliary dyskinesia 03/10/2017  . Pain of upper abdomen   . 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 Neck nodule 06/12/2013  . Glucose intolerance (impaired glucose tolerance) 06/08/2013  . Male hypogonadism 06/08/2013  . Hyperprolactinemia ([place]) 08/19/2011  . Invasive pituitary adenoma ([place]) 08/19/2011  . 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 Graylon Good, PT, DPT 05/23/19, 5:49 PM  [facility] [address] [place], [address] Phone: [phone]   Fax:  [phone]  Name: Timothy Atkins MRN: [id number] Date of Birth: April 08, 1966

## 2019-05-25 ENCOUNTER — Encounter: Payer: Medicaid Other | Admitting: Physical Therapy

## 2019-05-30 ENCOUNTER — Ambulatory Visit: Payer: BC Managed Care – PPO | Attending: Internal Medicine | Admitting: Physical Therapy

## 2019-05-30 DIAGNOSIS — M25561 Pain in right knee: Secondary | ICD-10-CM | POA: Insufficient documentation

## 2019-05-30 DIAGNOSIS — M25661 Stiffness of right knee, not elsewhere classified: Secondary | ICD-10-CM | POA: Insufficient documentation

## 2019-05-30 DIAGNOSIS — R262 Difficulty in walking, not elsewhere classified: Secondary | ICD-10-CM | POA: Insufficient documentation

## 2019-06-05 ENCOUNTER — Telehealth: Payer: Self-pay | Admitting: Physical Therapy

## 2019-06-05 ENCOUNTER — Ambulatory Visit: Payer: BC Managed Care – PPO | Admitting: Physical Therapy

## 2019-06-05 NOTE — Telephone Encounter (Signed)
Called patient when he did not show up for his appointment at 4:30pm today. Patient answered and I informed him of the missed visit and that I was checking on him. I also informed him that this is the second no-show in a row. He reported that he was unable to make the last appointment due to work obligations and he did not know when his next appointment after that was scheduled. He states he had some trouble with pain last week and would like to come in tomorrow evening if possible. Agreed to schedule him at 6pm tomorrow and he also agreed to come to his Wednesday evening appointment the following day. Patient agreed he wanted to have more appointments scheduled at 2 times a week in the evenings. Stated the earliest he can come is 4:30pm but it is better after 5 or 5:30pm. Informed front office staff of these requests.   Everlean Alstrom. Graylon Good, PT, DPT 06/05/19, 5:07 PM

## 2019-06-06 ENCOUNTER — Ambulatory Visit: Payer: BC Managed Care – PPO | Admitting: Physical Therapy

## 2019-06-07 ENCOUNTER — Other Ambulatory Visit: Payer: Self-pay

## 2019-06-07 ENCOUNTER — Ambulatory Visit: Payer: BC Managed Care – PPO | Admitting: Physical Therapy

## 2019-06-07 DIAGNOSIS — M25561 Pain in right knee: Secondary | ICD-10-CM | POA: Diagnosis present

## 2019-06-07 DIAGNOSIS — R262 Difficulty in walking, not elsewhere classified: Secondary | ICD-10-CM

## 2019-06-07 DIAGNOSIS — M25661 Stiffness of right knee, not elsewhere classified: Secondary | ICD-10-CM

## 2019-06-07 NOTE — Therapy (Signed)
[facility] [address] [place], [address] Phone: [phone]   Fax:  [phone]  Physical Therapy Treatment  Patient Details  Name: Timothy Atkins MRN: [id number] Date of Birth: December 26, 1965 Referring Provider (PT): Perrin Maltese MD   Encounter Date: 06/07/2019  PT End of Session - 06/08/19 1945    Visit Number  12    Number of Visits  16    Date for PT Re-Evaluation  06/20/19    Authorization Type  Medicaid reporting period from 03/14/2019 (new reporting period from 05/09/2019);    Authorization Time Period  Current cert period: 7/32/2025 - 06/20/2019 (latest PN: 05/09/2019);    Authorization - Visit Number  8    Authorization - Number of Visits  12    PT Start Time  1610    PT Stop Time  1650    PT Time Calculation (min)  40 min    Activity Tolerance  Patient tolerated treatment well    Behavior During Therapy  WFL for tasks assessed/performed       Past Medical History:  Diagnosis Date  . Arthritis   . Benign cyst of right kidney   . Constipation   . Degenerative cervical disc    mild   . Fatty liver   . Fatty liver   . Glucose intolerance (impaired glucose tolerance) 06/08/2013  . Hyperlipidemia   . Hyperprolactinemia ([place]) 08/19/2011   Overview:  05/2004 1363 ng/ml  . 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 Invasive pituitary adenoma ([place]) 08/19/2011  . Male hypogonadism 06/08/2013  . Neck nodule 06/12/2013  . 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 Pituitary tumor   . Prediabetes   . Rosacea   . RUQ pain 03/08/2017   Post RUQ that radiates Ant RUQ. 3 severe episodes over 3 weeks    Past Surgical History:   Procedure Laterality Date  . APPENDECTOMY  as child  . TONSILLECTOMY  as child    There were no vitals filed for this visit.  Subjective Assessment - 06/07/19 1612    Subjective  Pateint reports he has 3-4/10 pain in the R knee. He reports he felt better following last treatment for about 2 days. He felt that as his upper leg started to feel better, his right knee swelled up for no apparent position and hurt to about 8-9/10. States last week was pretty rought but not as bad as it was before. He feels that it is not getting as bad as it used to when he drives. Denies numbnes/tingling. Little bit of pain in the foot currently, mostly in the heel. Seems to be better when he is more active. He has been very busy at work lately and unable to come to some of his appointments. Did not do any of his exercises last week but condition improved this week when he started them again. Did not do the exercises when he was in pain.    Pertinent History  Patient is a 53 y.o. male who presents to outpatient physical therapy with a referral for medical diagnosis R knee pain. This patient's chief complaints consist of posterior and knee and hamstring pain, knee stiffness and interference with usual activities,  leading to the following functional deficits: difficulty walking, bending, kneeling, being active, sitting following walking. Relevant past medical history and comorbidities include pituitary macroadenoma (currently being successfully treated with medication, benign), hypogonadotropic hypogonadism syndrome, partial hypopituitarism, fatty liver, benign cyst of R kidney, pre-diabetes, appendectomy, biliary dyskinesia, allergic rhinitis, sensitivity to latex    Limitations  Sitting;Walking;Lifting;House hold activities;Other (comment)    Diagnostic tests  Radiograph from 01/15/2017: FINDINGS:  Mild medial femorotibial narrowing with tibial spine spurring. Small superior patellar osteophyte. No acute fracture or  dislocation. Small knee effusion. No soft tissue swelling.    Patient Stated Goals  to get better: 80-90% would be good    Currently in Pain?  Yes    Pain Score  4     Pain Location  Leg    Pain Orientation  Right    Pain Onset  More than a month ago       TREATMENT: **CONFIRMS SENSITIVITY TO LATEX** Denies history of spinal surgery Denies long term use of steroid medication.  Therapeutic exercise:to centralize symptoms and improve ROM, strength, muscular endurance, and activity tolerance required for successful completion of functional activities. Carole Civil.53mh with nograde and no UE support.For improved lower extremity mobility, muscular endurance, and weightbearing activity tolerance; and to induce the analgesic effect of aerobic exercise, stimulate improved joint nutrition, and prepare body structures and systems for following interventions. 5 minutes during subjective exam. -leg discrepancyre-assessed. mild discrepancy this sessionnear 1/4 inch.Pt presented with R lower extremity shortened compared to L. Improved after 2 sets of MET. -MET ofisometricR hip flexionin varying degrees of hip extension/flexion, MET of isometricL hamstring/hip extension in varying degrees of hip/knee flexion.3roundswith PT facilitation  (3 reps with 5 second holdsat each angle).Isometric hip abductionat three angles (3 x 10 second holds at each angle with strap around knees)andisometrichip abduction performed(x10 seconds); 3 round facilitated by clinician. Checked for improvement between rounds with improvement noted per observation and reported by patient.Ambulation between rounds to assess pt response.Stated that it felt improved, loosernoted 4/10 pain by end of session.  HOME EXERCISE PROGRAM - Walking 20 min a day - prone press ups x 10, 6x a day and before and after traveling -isometrics MET for pelvic alignment -prone glute isometric set with heels pressed together  (knees bent)   PT Education - 06/08/19 1944    Education Details  exercise form/technique, condition. importance of regular attendance for progress    Person(s) Educated  Patient    Methods  Explanation;Demonstration    Comprehension  Verbalized understanding;Returned demonstration       PT Short Term Goals - 05/09/19 1930      PT SHORT TERM GOAL #1   Title  Be independent with initial home exercise program for self-management of symptoms.    Baseline  Initial HEP provided at IE (03/14/2019);     Time  2    Period  Weeks    Status  Achieved    Target Date  03/28/19        PT Long Term Goals - 05/09/19 1652      PT LONG TERM GOAL #1   Title  Be independent with a long-term home exercise program for self-management of symptoms.     Baseline  Initial HEP provided at IE (03/14/2019); patient currently participating in HEP as presscribed but has not yet received long term HEP (04/06/2019; 05/09/2019);    Time  6    Period  Weeks    Status  Partially Met    Target Date  06/20/19      PT LONG TERM GOAL #2   Title  Demonstrate improved FOTO score by 10 units to demonstrate improvement in overall condition and self-reported functional ability.     Baseline  FOTO = 26 (03/14/2019); FOTO = 24 (04/06/2019);  FOTO = 22 (05/09/2019):\;    Time  6    Period  Weeks    Status  On-going    Target Date  06/20/19      PT LONG TERM GOAL #3   Title  Patient will demonstrate 0 degrees AROM knee extension without pain to improve function for weight bearing activities.     Baseline  lacking 5 degrees painful (03/14/2019); lacking 5 degrees painful (04/06/2019); lacking 5 degrees mildly painful ( 05/09/2019)    Time  6    Period  Weeks    Status  On-going    Target Date  06/20/19      PT LONG TERM GOAL #4   Title  Patient will demonstrate R knee flexion strength in prone at least 4+/5 with no pain with tibia internally rotated  to improve patient's ability to complete functional activities such as  stepping, swing phase of gait.     Baseline  painful 4/5 (03/14/2019); painful 4/5 (04/06/2019); painful (6/10)  4+/5 (05/09/2019);    Time  6    Period  Weeks    Status  On-going    Target Date  06/20/19      PT LONG TERM GOAL #5   Title  Complete community, work and/or recreational activities without limitation due to current condition.     Baseline  difficulty walking, bending, kneeling, being active, sitting following walking, getting up and down from floor, activities requiring use of R LE (03/14/2019, 04/06/2019); improving feels 40% better per pt report (05/09/2019);    Time  6    Period  Weeks    Status  Partially Met    Target Date  06/20/19            Plan - 06/08/19 1947    Clinical Impression Statement  Patient tolerated treatment well and reported reduction in pain and stiffness after each round of MET. Educated patient on the importance of frequent visits to make lasting progress, and patient agreed. Patient continues to respond well to MET at this point for pain relief and decreased stiffness in the R knee. Patient would benefit from continued physical therapy to address remaining impairments and functional limitations to work towards stated goals and return to PLOF or maximal functional independence.    Comorbidities   pituitary macroadenoma (currently being successfully treated with medication, benign), hypogonadotropic hypogonadism syndrome, partial hypopituitarism, fatty liver, benign cyst of R kidney, pre-diabetes, appendectomy, biliary dyskinesia, allergic rhinitis, sensitivity to latex, obesity.     Examination-Activity Limitations  Squat;Stand;Sit;Carry;Lift;Locomotion Level;Other;Bend    Examination-Participation Restrictions  Yard Work;Other;Community Activity;Interpersonal Relationship    Rehab Potential  Good    PT Frequency  2x / week    PT Duration  6 weeks    PT Treatment/Interventions  ADLs/Self Care Home Management;Cryotherapy;Moist Heat;Gait  training;Functional mobility training;Therapeutic activities;Therapeutic exercise;Balance training;Neuromuscular re-education;Patient/family education;Manual techniques;Passive range of motion;Dry needling;Taping;Spinal Manipulations;Joint Manipulations    PT Next Visit Plan  continue with METs, glute activation exercises progressing to functional activities as tolerated    PT Home Exercise Plan  Medbridge Access Code: [email or web address]     Consulted and Agree with Plan of Care  Patient       Patient will benefit from skilled therapeutic  intervention in order to improve the following deficits and impairments:  Abnormal gait, Decreased activity tolerance, Decreased endurance, Decreased range of motion, Decreased strength, Impaired perceived functional ability, Pain, Obesity, Increased muscle spasms, Difficulty walking, Decreased mobility  Visit Diagnosis: 1. Difficulty in walking, not elsewhere classified   2. Right knee pain, unspecified chronicity   3. Stiffness of right knee, not elsewhere classified        Problem List Patient Active Problem List   Diagnosis Date Noted  . Sinusitis 07/11/2018  . Prediabetes 02/16/2018  . Arthritis 01/31/2018  . Fatty liver 01/31/2018  . Allergic rhinitis 01/31/2018  . Pituitary macroadenoma ([place]) 01/31/2018  . Biliary dyskinesia 03/10/2017  . Pain of upper abdomen   . 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 Neck nodule 06/12/2013  . Glucose intolerance (impaired glucose tolerance) 06/08/2013  . Male hypogonadism 06/08/2013  . Hyperprolactinemia ([place]) 08/19/2011  . Invasive pituitary adenoma ([place]) 08/19/2011  . 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 Graylon Good, PT, DPT 06/08/19, 7:48 PM  [facility] [address], [place], [address] Phone: [phone]   Fax:  [phone]  Name: Timothy Atkins MRN: [id number] Date of Birth: [birth year]

## 2019-06-08 ENCOUNTER — Encounter: Payer: Self-pay | Admitting: Physical Therapy

## 2019-06-13 ENCOUNTER — Ambulatory Visit: Payer: BC Managed Care – PPO | Admitting: Physical Therapy

## 2019-06-15 ENCOUNTER — Ambulatory Visit: Payer: BC Managed Care – PPO | Admitting: Physical Therapy

## 2019-06-20 ENCOUNTER — Other Ambulatory Visit: Payer: Self-pay

## 2019-06-20 ENCOUNTER — Ambulatory Visit: Payer: BC Managed Care – PPO | Admitting: Physical Therapy

## 2019-06-20 DIAGNOSIS — R262 Difficulty in walking, not elsewhere classified: Secondary | ICD-10-CM | POA: Diagnosis not present

## 2019-06-20 DIAGNOSIS — M25561 Pain in right knee: Secondary | ICD-10-CM

## 2019-06-20 DIAGNOSIS — M25661 Stiffness of right knee, not elsewhere classified: Secondary | ICD-10-CM

## 2019-06-20 NOTE — Therapy (Signed)
[facility] [address] [place], [address] Phone: [phone]   Fax:  [phone]  Physical Therapy Treatment  Patient Details  Name: Timothy Atkins MRN: [id number] Date of Birth: [birth year] Referring Provider (PT): Perrin Maltese MD   Encounter Date: 06/20/2019  PT End of Session - 06/20/19 1852    Visit Number  13    Number of Visits  16    Date for PT Re-Evaluation  06/20/19    Authorization Type  Medicaid reporting period from 03/14/2019 (new reporting period from 05/09/2019);    Authorization Time Period  Current cert period: 03/11/16 - 06/20/2019 (latest PN: 05/09/2019);    Authorization - Visit Number  9    Authorization - Number of Visits  12    PT Start Time  1805    PT Stop Time  1845    PT Time Calculation (min)  40 min    Activity Tolerance  Patient tolerated treatment well    Behavior During Therapy  WFL for tasks assessed/performed       Past Medical History:  Diagnosis Date  . Arthritis   . Benign cyst of right kidney   . Constipation   . Degenerative cervical disc    mild   . Fatty liver   . Fatty liver   . Glucose intolerance (impaired glucose tolerance) 06/08/2013  . Hyperlipidemia   . Hyperprolactinemia ([place]) 08/19/2011   Overview:  05/2004 1363 ng/ml  . 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 Invasive pituitary adenoma ([place]) 08/19/2011  . Male hypogonadism 06/08/2013  . Neck nodule 06/12/2013  . 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 Pituitary tumor   . Prediabetes   . Rosacea   . RUQ pain 03/08/2017   Post RUQ that radiates Ant RUQ. 3 severe episodes over 3 weeks    Past Surgical History:   Procedure Laterality Date  . APPENDECTOMY  as child  . TONSILLECTOMY  as child    There were no vitals filed for this visit.  Subjective Assessment - 06/20/19 1808    Subjective  Patient reports his pain is about 5/10 today and has changed locations. He mostly feels it in the R distal posterolateral thigh, proximal lateral calf, and tingling in pinky toe. Reports his pain has varied throughout the days since last treatment session. He reports he got about 3 days of relief following last treatment sessions. Continues to find sititng and travel difficult. Reports he gets some relief with self-massage to the back of the thigh since he doesn't have a roller.    Pertinent History  Patient is a 53 y.o. male who presents to outpatient physical therapy with a referral for medical diagnosis R knee pain. This patient's chief complaints consist of posterior and knee and hamstring pain, knee stiffness and interference with usual activities, leading to the following functional deficits: difficulty walking, bending, kneeling, being active, sitting following walking. Relevant past medical history and comorbidities include pituitary macroadenoma (currently being successfully treated with medication, benign), hypogonadotropic hypogonadism syndrome, partial hypopituitarism, fatty liver, benign cyst of R kidney, pre-diabetes, appendectomy, biliary dyskinesia, allergic rhinitis, sensitivity to latex    Limitations  Sitting;Walking;Lifting;House hold activities;Other (comment)    Diagnostic  tests  Radiograph from 01/15/2017: FINDINGS:  Mild medial femorotibial narrowing with tibial spine spurring. Small superior patellar osteophyte. No acute fracture or dislocation. Small knee effusion. No soft tissue swelling.    Patient Stated Goals  to get better: 80-90% would be good    Currently in Pain?  Yes    Pain Score  5     Pain Location  Leg    Pain Orientation  Left    Pain Onset  More than a month ago        TREATMENT: **CONFIRMS SENSITIVITY TO LATEX** Denies history of spinal surgery Denies long term use of steroid medication.  Therapeutic exercise:to centralize symptoms and improve ROM, strength, muscular endurance, and activity tolerance required for successful completion of functional activities. Carole Civil.49mh with nograde and no UE support.For improved lower extremity mobility, muscular endurance, and weightbearing activity tolerance; and to induce the analgesic effect of aerobic exercise, stimulate improved joint nutrition, and prepare body structures and systems for following interventions. 5 minutes during subjective exam. -leg discrepancyre-assessed. mild discrepancy this sessionnear 1/4 inch.Pt presented with R lower extremity shortened compared to L.mildly mproved after 1 set of MET. -MET ofisometricR hip flexionin varying degrees of hip extension/flexion, MET of isometricL hamstring/hip extension in varying degrees of hip/knee flexion.1roundwith PT facilitation (3 reps with 5 second holdsat each angle).Isometric hip abductionat three angles (3 x 10 second holds at each angle with strap around knees)andisometrichip abduction performed(x10 seconds);1round facilitated by clinician. Checked for improvement between rounds with improvement noted per observation and reported by patient.Ambulation between rounds to assess pt response.Stated that it felt improved, looser.  (manual therapy - see below) - educated on muscle rollers and foam rollers to help with improved self-massage for symptom relief.  - hamstring stretch supine with strap, both knees extended 3x30 seconds each side. Reported less tension in R compared to L.  - Prone glute set with ball squeeze between heels (slight hip ER), 5 second hold, x 20 reps to improve glute activation.  - sidelying clam shell x20 each side with instructions on self-palpation of the pelvis to better isolate hip external  rotators and prevent compensatory rotation of the pelvis. Patient rated medium difficulty and reports he can feel his glute muscles getting tired. Good form once instructed on technique.  - ambulation throughout the clinic to assess response to interventions.   Manual therapy: to reduce pain and tissue tension, improve range of motion, neuromodulation, in order to promote improved ability to complete functional activities. - STM to L posterior thigh, posterolateral thigh, triceps surae, with and without "the stick" assist. Noted for tightness in distal region of biceps femoris and medial mid hamstrings as well as pain and tightness in the lateral gastroc that improved with STM.   HOME EXERCISE PROGRAM - Walking 20 min a day - prone press ups x 10, 6x a day and before and after traveling - isometrics MET for pelvic alignment - prone glute isometric set with heels pressed together (knees bent) - sidelying clam shell, 20-30 each side daily or every other day    PT Education - 06/20/19 1918    Education Details  exercise form/technique, condition. importance of regular attendance for progress    Person(s) Educated  Patient    Methods  Explanation;Demonstration    Comprehension  Verbalized understanding;Returned demonstration       PT Short Term Goals - 05/09/19 1930      PT SHORT TERM GOAL #1   Title  Be independent with initial home  exercise program for self-management of symptoms.    Baseline  Initial HEP provided at IE (03/14/2019);     Time  2    Period  Weeks    Status  Achieved    Target Date  03/28/19        PT Long Term Goals - 05/09/19 1652      PT LONG TERM GOAL #1   Title  Be independent with a long-term home exercise program for self-management of symptoms.     Baseline  Initial HEP provided at IE (03/14/2019); patient currently participating in HEP as presscribed but has not yet received long term HEP (04/06/2019; 05/09/2019);    Time  6    Period  Weeks    Status   Partially Met    Target Date  06/20/19      PT LONG TERM GOAL #2   Title  Demonstrate improved FOTO score by 10 units to demonstrate improvement in overall condition and self-reported functional ability.     Baseline  FOTO = 26 (03/14/2019); FOTO = 24 (04/06/2019);  FOTO = 22 (05/09/2019):\;    Time  6    Period  Weeks    Status  On-going    Target Date  06/20/19      PT LONG TERM GOAL #3   Title  Patient will demonstrate 0 degrees AROM knee extension without pain to improve function for weight bearing activities.     Baseline  lacking 5 degrees painful (03/14/2019); lacking 5 degrees painful (04/06/2019); lacking 5 degrees mildly painful ( 05/09/2019)    Time  6    Period  Weeks    Status  On-going    Target Date  06/20/19      PT LONG TERM GOAL #4   Title  Patient will demonstrate R knee flexion strength in prone at least 4+/5 with no pain with tibia internally rotated  to improve patient's ability to complete functional activities such as stepping, swing phase of gait.     Baseline  painful 4/5 (03/14/2019); painful 4/5 (04/06/2019); painful (6/10)  4+/5 (05/09/2019);    Time  6    Period  Weeks    Status  On-going    Target Date  06/20/19      PT LONG TERM GOAL #5   Title  Complete community, work and/or recreational activities without limitation due to current condition.     Baseline  difficulty walking, bending, kneeling, being active, sitting following walking, getting up and down from floor, activities requiring use of R LE (03/14/2019, 04/06/2019); improving feels 40% better per pt report (05/09/2019);    Time  6    Period  Weeks    Status  Partially Met    Target Date  06/20/19            Plan - 06/20/19 1917    Clinical Impression Statement  Patient tolerated treatment well and continues to demonstrate excellent within-visit improvements. He reports decrease in pain by end of session from 5/10 to 0/10. Patient demonstrates good improvement in form with cuing and reproduction  of exercise for addition to HEP. Educated him about the importance of attendance and discussed treatment going forward. Mutually decided to go to 1x a week for improved attendance. Provided patient with an updated schedule of upcoming visits. Patient stated intent to keep in touch about any changes in his schedule or health that prevent him from attending or that requires re-schedule of any missed visits. Pt required multimodal cuing  for proper technique and to facilitate improved neuromuscular control, strength, range of motion, and functional ability resulting in improved performance and form. Patient would benefit from continued physical therapy to address remaining impairments and functional limitations to work towards stated goals and return to PLOF or maximal functional independence.    Comorbidities   pituitary macroadenoma (currently being successfully treated with medication, benign), hypogonadotropic hypogonadism syndrome, partial hypopituitarism, fatty liver, benign cyst of R kidney, pre-diabetes, appendectomy, biliary dyskinesia, allergic rhinitis, sensitivity to latex, obesity.     Examination-Activity Limitations  Squat;Stand;Sit;Carry;Lift;Locomotion Level;Other;Bend    Examination-Participation Restrictions  Yard Work;Other;Community Activity;Interpersonal Relationship    Rehab Potential  Good    PT Frequency  2x / week    PT Duration  6 weeks    PT Treatment/Interventions  ADLs/Self Care Home Management;Cryotherapy;Moist Heat;Gait training;Functional mobility training;Therapeutic activities;Therapeutic exercise;Balance training;Neuromuscular re-education;Patient/family education;Manual techniques;Passive range of motion;Dry needling;Taping;Spinal Manipulations;Joint Manipulations    PT Next Visit Plan  continue with METs, glute activation exercises progressing to functional activities as tolerated    PT Home Exercise Plan  Medbridge Access Code: [email or web address]     Consulted and Agree with Plan  of Care  Patient       Patient will benefit from skilled therapeutic intervention in order to improve the following deficits and impairments:  Abnormal gait, Decreased activity tolerance, Decreased endurance, Decreased range of motion, Decreased strength, Impaired perceived functional ability, Pain, Obesity, Increased muscle spasms, Difficulty walking, Decreased mobility  Visit Diagnosis: Difficulty in walking, not elsewhere classified  Right knee pain, unspecified chronicity  Stiffness of right knee, not elsewhere classified     Problem List Patient Active Problem List   Diagnosis Date Noted  . Sinusitis 07/11/2018  . Prediabetes 02/16/2018  . Arthritis 01/31/2018  . Fatty liver 01/31/2018  . Allergic rhinitis 01/31/2018  . Pituitary macroadenoma ([place]) 01/31/2018  . Biliary dyskinesia 03/10/2017  . Pain of upper abdomen   . 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 Neck nodule 06/12/2013  . Glucose intolerance (impaired glucose tolerance) 06/08/2013  . Male hypogonadism 06/08/2013  . Hyperprolactinemia ([place]) 08/19/2011  . Invasive pituitary adenoma ([place]) 08/19/2011  . 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 Graylon Good, PT, DPT 06/20/19, 7:21 PM  [facility] [address] [place], [address] Phone: [phone]   Fax:  [phone]  Name: Timothy Atkins MRN: [id number] Date of Birth: 11-23-1965

## 2019-06-22 ENCOUNTER — Ambulatory Visit: Payer: BC Managed Care – PPO | Admitting: Physical Therapy

## 2019-06-27 ENCOUNTER — Encounter: Payer: Self-pay | Admitting: Physical Therapy

## 2019-06-27 ENCOUNTER — Ambulatory Visit: Payer: BC Managed Care – PPO | Attending: Internal Medicine | Admitting: Physical Therapy

## 2019-06-27 ENCOUNTER — Other Ambulatory Visit: Payer: Self-pay

## 2019-06-27 DIAGNOSIS — R262 Difficulty in walking, not elsewhere classified: Secondary | ICD-10-CM | POA: Diagnosis present

## 2019-06-27 DIAGNOSIS — M25561 Pain in right knee: Secondary | ICD-10-CM

## 2019-06-27 DIAGNOSIS — M25661 Stiffness of right knee, not elsewhere classified: Secondary | ICD-10-CM | POA: Diagnosis present

## 2019-06-27 NOTE — Therapy (Signed)
[facility] [address], [place], [address] Phone: [phone]   Fax:  [phone]  Physical Therapy Treatment / Progress Note / Re-Certification Reporting Period:  05/09/2019 - 06/28/2019  Patient Details  Name: Timothy Atkins MRN: [id number] Date of Birth: [birth year]  Referring Provider (PT): Perrin Maltese MD   Encounter Date: 06/27/2019  PT End of Session - 06/27/19 1856    Visit Number  14    Number of Visits  16    Date for PT Re-Evaluation  08/09/19    Authorization Type  Medicaid reporting period from 05/09/2019 (new reporting period from 06/27/2019);    Authorization Time Period  Current cert period: 04/02/3418 - 08/09/2019 (latest PN: 06/27/2019);    Authorization - Visit Number  10    Authorization - Number of Visits  12    PT Start Time  1855    PT Stop Time  1943    PT Time Calculation (min)  48 min    Activity Tolerance  Patient tolerated treatment well    Behavior During Therapy  WFL for tasks assessed/performed       Past Medical History:  Diagnosis Date  . Arthritis   . Benign cyst of right kidney   . Constipation   . Degenerative cervical disc    mild   . Fatty liver   . Fatty liver   . Glucose intolerance (impaired glucose tolerance) 06/08/2013  . Hyperlipidemia   . Hyperprolactinemia ([place]) 08/19/2011   Overview:  05/2004 1363 ng/ml  . 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 Invasive pituitary adenoma ([place]) 08/19/2011  . Male hypogonadism 06/08/2013  . Neck nodule 06/12/2013  . 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 Pituitary tumor   . Prediabetes   . Rosacea   . RUQ pain 03/08/2017   Post RUQ that radiates  Ant RUQ. 3 severe episodes over 3 weeks    Past Surgical History:  Procedure Laterality Date  . APPENDECTOMY  as child  . TONSILLECTOMY  as child    There were no vitals filed for this visit.  Subjective Assessment - 06/27/19 1854    Subjective  Patient reports his pain has been improved to 4/10 today. He reports that overall his pain continues to vary a lot and he continues to have days of severe pain where he has difficulty walking, such as this past Saturday. He reports that he feels his condition has improved 60% since starting physical therapy. he reports he has had difficulty with attendance recently due to episodes of acute illness from the pituitary tumor or long work hours. He has agreed that this needs to improved and agreed to come to physical therapy one time a week to improve attendance at a lower rate. Feels he gets releif from most PT sessions and it can last from about 30 minutes (last session) to about 3 days. He believes he will benefit more if he comes more frequently.    Pertinent History  Patient is a 53 y.o. male who presents to outpatient physical therapy with a referral for medical diagnosis R knee pain. This patient's chief complaints consist of posterior and knee and hamstring pain, knee stiffness and interference with  usual activities, leading to the following functional deficits: difficulty walking, bending, kneeling, being active, sitting following walking. Relevant past medical history and comorbidities include pituitary macroadenoma (currently being successfully treated with medication, benign), hypogonadotropic hypogonadism syndrome, partial hypopituitarism, fatty liver, benign cyst of R kidney, pre-diabetes, appendectomy, biliary dyskinesia, allergic rhinitis, sensitivity to latex    Limitations  Sitting;Walking;Lifting;House hold activities;Other (comment)    Diagnostic tests  Radiograph from 01/15/2017: FINDINGS:  Mild medial femorotibial narrowing with tibial spine  spurring. Small superior patellar osteophyte. No acute fracture or dislocation. Small knee effusion. No soft tissue swelling.    Patient Stated Goals  to get better: 80-90% would be good    Currently in Pain?  Yes    Pain Score  4     Pain Location  Leg    Pain Orientation  Right    Pain Descriptors / Indicators  Aching;Dull    Pain Type  Chronic pain    Pain Radiating Towards  R ischial tuberosity to R calf. Has had intermittant paresthesia to foot previously. Now feels pain most commonly at lateral posterior distal thigh    Pain Onset  More than a month ago    Pain Frequency  Intermittent    Aggravating Factors   sitting, difficulty identifying    Pain Relieving Factors  ice, heat, massage, walking    Effect of Pain on Daily Activities  difficulty walking, bending, kneeling, being activie, sitting following walking, getting up and down from floor, activities requiring use of R LE, driving.         [facility] PT Assessment - 06/28/19 0001      Assessment   Medical Diagnosis  R knee pain    Referring Provider (PT)  Perrin Maltese MD    Onset Date/Surgical Date  01/12/19    Hand Dominance  Left   some things done with R   Next MD Visit  Maybe in August    Prior Therapy  currently in PT, none previously      Precautions   Precautions  None      Restrictions   Weight Bearing Restrictions  No      Home Environment   Living Environment  --   no concerns about home     Prior Function   Level of Independence  Independent    Vocation  Full time employment    Vocation Requirements  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       Cognition   Overall Cognitive Status  Within Functional Limits for tasks assessed      Observation/Other Assessments   Observations  see note from 06/27/2019 for latest objective data    Focus on Therapeutic Outcomes (FOTO)   FOTO =  22 (06/27/2019):         OBJECTIVE: OBSERVATION/INSPECTION: Patient presents withobesity,normalmuscle mass. R functional leg length discrepancy assessed in supine and found to have slight R leg shorter than left (~1/4 inch) prior to treatment. R distal hamstring region mildly larger than left but unable to determine significance  NEUROLOGICAL: Dermatomes:BLE equal and intact to light touch.  PERIPHERAL JOINT MOTION (AROM/PROM in degrees):  *Indicates pain Knee  Flexion: R = tissue approximation, L =tissue approximation   Extension: R =lacking 10/lacking 10 tender at anterior knee, L =0.  FLEXIBILITY  R hamstrings and hip adductors  tender when put on maximum stretch but no significant loss of motion compared to L side.   STRENGTH:  *Indicates pain Hip  Extension (knees flexed): R =4+/5, L =4+/5.  Adduction (knees straight): R = 3+/5, L = 5/5 Knee  Eccentric flexion in prone 3/5: Middle R = mild pain, Tibial medially rotated = 6/10 pain,  SPECIAL TESTS: Negative special tests on R: McMurrays, Varus and Valgus stress at 0 and 30 degrees, knee flexion overpressure/rotation.   PALPATION - TTP at R distal medial hamstring attachments near adductor tubercle and mid muscle belly in all hamstring muscles and over back of R knee.   - TTP bilaterally at adductor tubercle  ACCESSORY MOTION:  - R patella hypomobile - R tibiofemoral mildly hypomobile.   EDUCATION/COGNITION: Patient is alert and oriented X 4.  Objective measurements completed on examination: See above findings.    TREATMENT: **CONFIRMS SENSITIVITY TO LATEX** Denies history of spinal surgery Denies long term use of steroid medication.  Therapeutic exercise:to centralize symptoms and improve ROM, strength, muscular endurance, and activity tolerance required for successful completion of functional activities. - objective measurements to assess progress (see above) - examination of  ROM and strength to assess progress (see above). - Hamstring stretch with stretch in neutral and with adductor and lateral hip bias x10 seconds each position x 3. R leg only.  - sidelying R hip adduction with foam roller placed under L lower leg for improved stability. 3x10 to improve hip adductor strength.  - Sidelying clam shell with green theraband wrapped around knees. Cuing to stack hips and prevent hip motion. 3x10 plus time for transitions and to learn activity. Added red theraband.  - Education on HEP including handout   Manual therapy: to reduce pain and tissue tension, improve range of motion, neuromodulation, in order to promote improved ability to complete functional activities. - STM to posterior R thigh, hamstrings, hip adductors with and without "the stick" assist.    [facility] (updated 06/27/2019) Access Code: [email or web address]  URL: [email or web address]  Date: 06/27/2019  Prepared by: Rosita Kea   Exercises  Hamstring stretch (with strap) - 3 sets - 30s hold - 1x daily - 7x weekly  Sidelying Hip Adduction - 2-3 sets - 10 reps - 1x daily - 7x weekly  Clamshell with Resistance - 2-3 sets - 10 reps - 7x daily - 1x weekly     Patient response to treatment:  Pt tolerated treatment well and reported overall reduction in pain to 0/10 stating "I feel great!" by end of session. Updated HEP to reflect exercises that helped provide relief in clinic today. Pt required multimodal cuing for proper technique and to facilitate improved neuromuscular control, strength, range of motion, and functional ability resulting in improved performance and form.     PT Education - 06/28/19 1000    Education Details  exercise form/technique, condition. importance of regular attendance for progress, POC, HEP    Person(s) Educated  Patient    Methods  Explanation;Demonstration    Comprehension  Verbalized understanding;Returned demonstration       PT Short Term Goals -  06/27/19 1858      PT SHORT TERM GOAL #1   Title  Be independent with initial home exercise program for self-management of symptoms.    Baseline  Initial HEP provided at IE (03/14/2019);     Time  2    Period  Weeks    Status  Achieved    Target Date  03/28/19  PT Long Term Goals - 06/27/19 1859      PT LONG TERM GOAL #1   Title  Be independent with a long-term home exercise program for self-management of symptoms.     Baseline  Initial HEP provided at IE (03/14/2019); patient currently participating in HEP as presscribed but has not yet received long term HEP (04/06/2019; 05/09/2019, 06/27/2019)    Time  6    Period  Weeks    Status  Partially Met    Target Date  08/09/19      PT LONG TERM GOAL #2   Title  Demonstrate improved FOTO score by 10 units to demonstrate improvement in overall condition and self-reported functional ability.     Baseline  FOTO = 26 (03/14/2019); FOTO = 24 (04/06/2019); FOTO = 22 (05/09/2019); FOTO = 22 (06/27/2019):    Time  6    Period  Weeks    Status  On-going    Target Date  08/09/19      PT LONG TERM GOAL #3   Title  Patient will demonstrate 0 degrees AROM knee extension without pain to improve function for weight bearing activities.     Baseline  lacking 5 degrees painful (03/14/2019); lacking 5 degrees painful (04/06/2019); lacking 5 degrees mildly painful ( 05/09/2019); lacking 10 degrees mildy painful (06/27/2019)    Time  6    Period  Weeks    Status  On-going    Target Date  08/09/19      PT LONG TERM GOAL #4   Title  Patient will demonstrate R knee flexion strength in prone at least 4+/5 with no pain with tibia internally rotated  to improve patient's ability to complete functional activities such as stepping, swing phase of gait.     Baseline  painful 4/5 (03/14/2019); painful 4/5 (04/06/2019); painful (6/10)  4+/5 (05/09/2019); painful (6/10) 3/5.    Time  6    Period  Weeks    Status  On-going    Target Date  08/09/19      PT LONG TERM  GOAL #5   Title  Complete community, work and/or recreational activities without limitation due to current condition.     Baseline  difficulty walking, bending, kneeling, being active, sitting following walking, getting up and down from floor, activities requiring use of R LE (03/14/2019, 04/06/2019); improving feels 40% better per pt report (05/09/2019); states he feels 60% better since starting physical therapy (06/27/2019);    Time  6    Period  Weeks    Status  Partially Met    Target Date  08/09/19       Plan - 06/28/19 1020    Clinical Impression Statement  Patient has attended 14 skilled physical therapy treatment sessions this episode of care and has struggled to make good progress towards goals. Subjectively, patient reports continued variation of his symptoms that continue to include frequent episodes of severe leg pain that prevents him from walking, however he also reports that he has improved about 60% since starting physical therapy. His FOTO score has actually worsened slightly since initial evaluation and has remained at 22, reflecting poor self-reported functional improvement. Objectively, he demonstrates minimal improvements in strength or ROM. Patient has demonstrated very poor attendance lately due to work commitments and unexpected illness from pituitary tumor. This has been frequently dicussed with patient and he is now planning to attend once a week to improve participation. His lack of frequent or consistent sessions has likely contributed to his  lack of progress. Patient continues to demonstrate excellent within-visit reductions in pain. Recommend he continue physical therapy with consistent attendance to fully benefit from it but also follow up with medical doctor for further medical assessment for his condition due to lack of significant improvement. Patient continues to present with with significant pain, stiffness, motor control, weakness, ROM, and activity tolerance impairments  that are limiting ability to complete his usual activities such as walking, bending, twisting, floor and chair transfers, driving, working, and being generally active without without difficulty. Patient will benefit from continued skilled physical therapy intervention to address current body structure impairments and activity limitations to improve function and work towards goals set in current POC in order to return to prior level of function or maximal functional improvement.    Comorbidities   pituitary macroadenoma (currently being successfully treated with medication, benign), hypogonadotropic hypogonadism syndrome, partial hypopituitarism, fatty liver, benign cyst of R kidney, pre-diabetes, appendectomy, biliary dyskinesia, allergic rhinitis, sensitivity to latex, obesity.     Examination-Activity Limitations  Squat;Stand;Sit;Carry;Lift;Locomotion Level;Other;Bend    Examination-Participation Restrictions  Yard Work;Other;Community Activity;Interpersonal Relationship    Rehab Potential  Good    PT Frequency  2x / week    PT Duration  6 weeks    PT Treatment/Interventions  ADLs/Self Care Home Management;Cryotherapy;Moist Heat;Gait training;Functional mobility training;Therapeutic activities;Therapeutic exercise;Balance training;Neuromuscular re-education;Patient/family education;Manual techniques;Passive range of motion;Dry needling;Taping;Spinal Manipulations;Joint Manipulations    PT Next Visit Plan  continue with METs, glute activation exercises progressing to functional activities as tolerated    PT Home Exercise Plan  Medbridge Access Code: [email or web address]     Recommended Other Services  follow up with physician for further medical assesment of condition    Consulted and Agree with Plan of Care  Patient       Patient will benefit from skilled therapeutic intervention in order to improve the following deficits and impairments:  Abnormal gait, Decreased activity tolerance, Decreased endurance,  Decreased range of motion, Decreased strength, Impaired perceived functional ability, Pain, Obesity, Increased muscle spasms, Difficulty walking, Decreased mobility  Visit Diagnosis: Difficulty in walking, not elsewhere classified  Right knee pain, unspecified chronicity  Stiffness of right knee, not elsewhere classified     Problem List Patient Active Problem List   Diagnosis Date Noted  . Sinusitis 07/11/2018  . Prediabetes 02/16/2018  . Arthritis 01/31/2018  . Fatty liver 01/31/2018  . Allergic rhinitis 01/31/2018  . Pituitary macroadenoma ([place]) 01/31/2018  . Biliary dyskinesia 03/10/2017  . Pain of upper abdomen   . 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 Neck nodule 06/12/2013  . Glucose intolerance (impaired glucose tolerance) 06/08/2013  . Male hypogonadism 06/08/2013  . Hyperprolactinemia ([place]) 08/19/2011  . Invasive pituitary adenoma ([place]) 08/19/2011  . 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 Graylon Good, PT, DPT 06/28/19, 10:22 AM  [facility] [address], [place], [address] Phone: [phone]   Fax:  [phone]  Name: Timothy Atkins MRN: [id number] Date of Birth: 08/21/1966

## 2019-07-04 ENCOUNTER — Ambulatory Visit: Payer: BC Managed Care – PPO

## 2019-07-04 ENCOUNTER — Encounter: Payer: Self-pay | Admitting: Physical Therapy

## 2019-07-04 ENCOUNTER — Other Ambulatory Visit: Payer: Self-pay

## 2019-07-04 DIAGNOSIS — R262 Difficulty in walking, not elsewhere classified: Secondary | ICD-10-CM

## 2019-07-04 DIAGNOSIS — M25561 Pain in right knee: Secondary | ICD-10-CM

## 2019-07-04 DIAGNOSIS — M25661 Stiffness of right knee, not elsewhere classified: Secondary | ICD-10-CM

## 2019-07-04 NOTE — Therapy (Signed)
[facility] [address] [place], [address] Phone: [phone]   Fax:  [phone]  Physical Therapy Treatment  Patient Details  Name: Timothy Atkins MRN: [id number] Date of Birth: May 09, 1966 Referring Provider (PT): Perrin Maltese MD   Encounter Date: 07/04/2019  PT End of Session - 07/04/19 1715    Visit Number  15    Number of Visits  16    Date for PT Re-Evaluation  08/09/19    Authorization Type  Medicaid reporting period from 05/09/2019 (new reporting period from 06/27/2019);    Authorization Time Period  Current cert period: 10/31/3844 - 08/09/2019 (latest PN: 06/27/2019);    Authorization - Visit Number  11    Authorization - Number of Visits  12    PT Start Time  6599    PT Stop Time  1800    PT Time Calculation (min)  45 min    Activity Tolerance  Patient tolerated treatment well    Behavior During Therapy  WFL for tasks assessed/performed       Past Medical History:  Diagnosis Date  . Arthritis   . Benign cyst of right kidney   . Constipation   . Degenerative cervical disc    mild   . Fatty liver   . Fatty liver   . Glucose intolerance (impaired glucose tolerance) 06/08/2013  . Hyperlipidemia   . Hyperprolactinemia ([place]) 08/19/2011   Overview:  05/2004 1363 ng/ml  . 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 Invasive pituitary adenoma ([place]) 08/19/2011  . Male hypogonadism 06/08/2013  . Neck nodule 06/12/2013  . 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 Pituitary tumor   . Prediabetes   . Rosacea   . RUQ pain 03/08/2017   Post RUQ that radiates Ant RUQ. 3 severe episodes over 3 weeks    Past Surgical History:   Procedure Laterality Date  . APPENDECTOMY  as child  . TONSILLECTOMY  as child    There were no vitals filed for this visit.  Subjective Assessment - 07/04/19 1719    Subjective  Patient reported that he felt good after his last PT session, until he sat in his car and had some soreness. Resolved quite a bit until Sunday. He reported an increase in pain and numbness in RLE, as well as increased R knee pain. Stated some of his pain was improved after medication.    Pertinent History  Patient is a 53 y.o. male who presents to outpatient physical therapy with a referral for medical diagnosis R knee pain. This patient's chief complaints consist of posterior and knee and hamstring pain, knee stiffness and interference with usual activities, leading to the following functional deficits: difficulty walking, bending, kneeling, being active, sitting following walking. Relevant past medical history and comorbidities include pituitary macroadenoma (currently being successfully treated with medication, benign), hypogonadotropic hypogonadism syndrome, partial hypopituitarism, fatty liver, benign cyst of R kidney, pre-diabetes, appendectomy, biliary dyskinesia, allergic rhinitis, sensitivity to latex    Limitations  Sitting;Walking;Lifting;House hold activities;Other (comment)    Diagnostic tests  Radiograph from 01/15/2017: FINDINGS:  Mild medial femorotibial narrowing with tibial spine spurring. Small superior patellar osteophyte. No acute fracture or dislocation. Small knee effusion. No  soft tissue swelling.    Patient Stated Goals  to get better: 80-90% would be good    Currently in Pain?  Yes    Pain Score  4     Pain Location  Leg    Pain Orientation  Right    Pain Descriptors / Indicators  Aching;Dull    Pain Type  Chronic pain    Pain Radiating Towards  R ishial tuberosity to calf, does have some R pinkie toe numbness    Pain Onset  More than a month ago       TREATMENT:  **CONFIRMS SENSITIVITY TO  LATEX** Denies history of spinal surgery Denies long term use of steroid medication.    Therapeutic exercise: to centralize symptoms and improve ROM, strength, muscular endurance, and activity tolerance required for successful completion of functional activities. - Hamstring stretch with stretch in neutral 3x30secs holds bilterally  - sidelying R hip adduction with foam roller placed under L lower leg for improved stability. 3x10 to improve hip adductor strength.  - Sidelying clam shell with green theraband wrapped around knees. Cuing to stack hips and prevent hip motion. 2x15  plus time for transitions and to learn activity. Added green theraband.  -  MET of isometric R hip flexion in varying degrees of hip extension/flexion, MET of isometric L hamstring/hip extension in varying degrees of hip/knee flexion. 1 round with PT facilitation  (3 reps with 5 second holds at each angle).     Manual therapy: to reduce pain and tissue tension, improve range of motion, neuromodulation, in order to promote improved ability to complete functional activities. - STM to posterior R thigh, hamstrings, hip adductors with and without "the stick" assist (deep pressure relief on R hamstring via elbow)     [facility] (updated 06/27/2019) Access Code: [email or web address]  URL: [email or web address]  Date: 06/27/2019  Prepared by: Rosita Kea   Exercises   Hamstring stretch (with strap) - 3 sets - 30s hold - 1x daily - 7x weekly   Sidelying Hip Adduction - 2-3 sets - 10 reps - 1x daily - 7x weekly   Clamshell with Resistance - 2-3 sets - 10 reps - 7x daily - 1x weekly       Patient response to treatment:  Pt tolerated treatment well and reported overall reduction in pain, from 4/10 to 2/10. Patient remains motivated to continue to participate in therapy to continue to assess and address pain and pain management as able. Pt reported inconsistent numbness in R foot/pinkie toe during session, once  with hamstring stretch on RLE. The patient would benefit from continued skilled PT to continue progress towards goals.   HOME EXERCISE PROGRAM - Walking 20 min a day - prone press ups x 10, 6x a day and before and after traveling - isometrics MET for pelvic alignment - prone glute isometric set with heels pressed together (knees bent)  - sidelying clam shell, 20-30 each side daily or every other day      PT Education - 07/04/19 1715    Education Details  exercise form/technique, HEP    Person(s) Educated  Patient    Methods  Explanation;Demonstration    Comprehension  Verbalized understanding;Returned demonstration       PT Short Term Goals - 06/27/19 1858      PT SHORT TERM GOAL #1   Title  Be independent with initial home exercise program for self-management of symptoms.    Baseline  Initial HEP provided at IE (03/14/2019);  Time  2    Period  Weeks    Status  Achieved    Target Date  03/28/19        PT Long Term Goals - 06/27/19 1859      PT LONG TERM GOAL #1   Title  Be independent with a long-term home exercise program for self-management of symptoms.     Baseline  Initial HEP provided at [facility] (03/14/2019); patient currently participating in HEP as presscribed but has not yet received long term HEP (04/06/2019; 05/09/2019, 06/27/2019)    Time  6    Period  Weeks    Status  Partially Met    Target Date  08/09/19      PT LONG TERM GOAL #2   Title  Demonstrate improved FOTO score by 10 units to demonstrate improvement in overall condition and self-reported functional ability.     Baseline  FOTO = 26 (03/14/2019); FOTO = 24 (04/06/2019); FOTO = 22 (05/09/2019); FOTO = 22 (06/27/2019):    Time  6    Period  Weeks    Status  On-going    Target Date  08/09/19      PT LONG TERM GOAL #3   Title  Patient will demonstrate 0 degrees AROM knee extension without pain to improve function for weight bearing activities.     Baseline  lacking 5 degrees painful (03/14/2019); lacking  5 degrees painful (04/06/2019); lacking 5 degrees mildly painful ( 05/09/2019); lacking 10 degrees mildy painful (06/27/2019)    Time  6    Period  Weeks    Status  On-going    Target Date  08/09/19      PT LONG TERM GOAL #4   Title  Patient will demonstrate R knee flexion strength in prone at least 4+/5 with no pain with tibia internally rotated  to improve patient's ability to complete functional activities such as stepping, swing phase of gait.     Baseline  painful 4/5 (03/14/2019); painful 4/5 (04/06/2019); painful (6/10)  4+/5 (05/09/2019); painful (6/10) 3/5.    Time  6    Period  Weeks    Status  On-going    Target Date  08/09/19      PT LONG TERM GOAL #5   Title  Complete community, work and/or recreational activities without limitation due to current condition.     Baseline  difficulty walking, bending, kneeling, being active, sitting following walking, getting up and down from floor, activities requiring use of R LE (03/14/2019, 04/06/2019); improving feels 40% better per pt report (05/09/2019); states he feels 60% better since starting physical therapy (06/27/2019);    Time  6    Period  Weeks    Status  Partially Met    Target Date  08/09/19            Plan - 07/04/19 1826    Clinical Impression Statement  Pt tolerated treatment well and reported overall reduction in pain, from 4/10 to 2/10. Patient remains motivated to continue to participate in therapy to continue to assess and address pain and pain management as able. Pt reported inconsistent numbness in R foot/pinkie toe during session, once with hamstring stretch on RLE. The patient would benefit from continued skilled PT to continue progress towards goals.    Personal Factors and Comorbidities  Comorbidity 3+    Comorbidities   pituitary macroadenoma (currently being successfully treated with medication, benign), hypogonadotropic hypogonadism syndrome, partial hypopituitarism, fatty liver, benign cyst of R kidney, pre-diabetes,  appendectomy, biliary dyskinesia, allergic rhinitis, sensitivity to latex, obesity.     Examination-Activity Limitations  Squat;Stand;Sit;Carry;Lift;Locomotion Level;Other;Bend    Examination-Participation Restrictions  Yard Work;Other;Community Activity;Interpersonal Relationship    Rehab Potential  Good    PT Frequency  2x / week    PT Duration  6 weeks    PT Treatment/Interventions  ADLs/Self Care Home Management;Cryotherapy;Moist Heat;Gait training;Functional mobility training;Therapeutic activities;Therapeutic exercise;Balance training;Neuromuscular re-education;Patient/family education;Manual techniques;Passive range of motion;Dry needling;Taping;Spinal Manipulations;Joint Manipulations    PT Next Visit Plan  continue with METs, glute activation exercises progressing to functional activities as tolerated    PT Home Exercise Plan  Medbridge Access Code: [email or web address]     Consulted and Agree with Plan of Care  Patient       Patient will benefit from skilled therapeutic intervention in order to improve the following deficits and impairments:  Abnormal gait, Decreased activity tolerance, Decreased endurance, Decreased range of motion, Decreased strength, Impaired perceived functional ability, Pain, Obesity, Increased muscle spasms, Difficulty walking, Decreased mobility  Visit Diagnosis: Difficulty in walking, not elsewhere classified  Right knee pain, unspecified chronicity  Stiffness of right knee, not elsewhere classified     Problem List Patient Active Problem List   Diagnosis Date Noted  . Sinusitis 07/11/2018  . Prediabetes 02/16/2018  . Arthritis 01/31/2018  . Fatty liver 01/31/2018  . Allergic rhinitis 01/31/2018  . Pituitary macroadenoma ([place]) 01/31/2018  . Biliary dyskinesia 03/10/2017  . Pain of upper abdomen   . 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 Neck nodule 06/12/2013  . Glucose intolerance (impaired glucose tolerance) 06/08/2013  . Male  hypogonadism 06/08/2013  . Hyperprolactinemia ([place]) 08/19/2011  . Invasive pituitary adenoma ([place]) 08/19/2011  . 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, [place], [address] Phone: [phone]   Fax:  [phone]  Name: Timothy Atkins MRN: [id number] Date of Birth: [birth year]

## 2019-07-06 ENCOUNTER — Ambulatory Visit: Payer: BC Managed Care – PPO | Admitting: Physical Therapy

## 2019-07-10 ENCOUNTER — Ambulatory Visit: Payer: BC Managed Care – PPO | Admitting: Physical Therapy

## 2019-07-12 ENCOUNTER — Encounter: Payer: BC Managed Care – PPO | Admitting: Physical Therapy

## 2019-07-14 ENCOUNTER — Other Ambulatory Visit: Payer: Self-pay | Admitting: Internal Medicine

## 2019-07-14 DIAGNOSIS — J329 Chronic sinusitis, unspecified: Secondary | ICD-10-CM

## 2019-07-14 MED ORDER — CETIRIZINE HCL 10 MG PO TABS: 10.0000 mg | ORAL_TABLET | Freq: Every day | ORAL | 3 refills | Status: DC

## 2019-07-14 MED ORDER — FLUTICASONE PROPIONATE 50 MCG/ACT NA SUSP: 2.0000 | Freq: Every day | NASAL | 11 refills | Status: DC

## 2019-07-31 ENCOUNTER — Encounter: Payer: Self-pay | Admitting: Physical Therapy

## 2019-07-31 DIAGNOSIS — M25561 Pain in right knee: Secondary | ICD-10-CM

## 2019-07-31 DIAGNOSIS — R262 Difficulty in walking, not elsewhere classified: Secondary | ICD-10-CM

## 2019-07-31 DIAGNOSIS — M25661 Stiffness of right knee, not elsewhere classified: Secondary | ICD-10-CM

## 2019-07-31 NOTE — Therapy (Signed)
[facility] [address] [place], [address] Phone: [phone]   Fax:  [phone]  Physical Therapy No-Visit Discharge Summary Reporting period: 03/14/2019 - 07/31/2019  Patient Details  Name: Timothy Atkins MRN: [id number] Date of Birth: 02/28/1966 Referring Provider (PT): Perrin Maltese MD   Encounter Date: 07/31/2019    Past Medical History:  Diagnosis Date  . Arthritis   . Benign cyst of right kidney   . Constipation   . Degenerative cervical disc    mild   . Fatty liver   . Fatty liver   . Glucose intolerance (impaired glucose tolerance) 06/08/2013  . Hyperlipidemia   . Hyperprolactinemia ([place]) 08/19/2011   Overview:  05/2004 1363 ng/ml  . 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 Invasive pituitary adenoma ([place]) 08/19/2011  . Male hypogonadism 06/08/2013  . Neck nodule 06/12/2013  . 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 Pituitary tumor   . Prediabetes   . Rosacea   . RUQ pain 03/08/2017   Post RUQ that radiates Ant RUQ. 3 severe episodes over 3 weeks    Past Surgical History:  Procedure Laterality Date  . APPENDECTOMY  as child  . TONSILLECTOMY  as child    There were no vitals filed for this visit.  Subjective Assessment - 07/31/19 1405    Subjective  Patient did not show up for his last scheduled visit duirng most recent insurance authorization period. Patient was not contacted due to previous repeated discusions about importance of attendance and communication with clinic and patient being aware of his scheduled visit confirmed at prior visit and understanding how attendance affects requests for more insurance  authorization and progress. Patient has history of multiple no-shows. Insurance authorization has now expired.    Pertinent History  Patient is a 53 y.o. male who presents to outpatient physical therapy with a referral for medical diagnosis R knee pain. This patient's chief complaints consist of posterior and knee and hamstring pain, knee stiffness and interference with usual activities, leading to the following functional deficits: difficulty walking, bending, kneeling, being active, sitting following walking. Relevant past medical history and comorbidities include pituitary macroadenoma (currently being successfully treated with medication, benign), hypogonadotropic hypogonadism syndrome, partial hypopituitarism, fatty liver, benign cyst of R kidney, pre-diabetes, appendectomy, biliary dyskinesia, allergic rhinitis, sensitivity to latex    Limitations  Sitting;Walking;Lifting;House hold activities;Other (comment)    Diagnostic tests  Radiograph from 01/15/2017: FINDINGS:  Mild medial femorotibial narrowing with tibial spine spurring. Small superior patellar osteophyte. No acute fracture or dislocation. Small knee effusion. No soft tissue swelling.    Patient Stated Goals  to get better: 80-90% would be good    Pain Onset  More than a month ago       OBJECTIVE Patient not present for measurement. Please see prior documentation for latest objective data.     PT Short Term Goals - 07/31/19 1409      PT SHORT TERM GOAL #1   Title  Be independent with initial home exercise program for self-management of symptoms.    Baseline  Initial HEP provided at IE (03/14/2019);     Time  2    Period  Weeks  Status  Achieved    Target Date  03/28/19        PT Long Term Goals - 07/31/19 1409      PT LONG TERM GOAL #1   Title  Be independent with a long-term home exercise program for self-management of symptoms.     Baseline  Initial HEP provided at IE (03/14/2019); patient currently participating in HEP  as presscribed but has not yet received long term HEP (04/06/2019; 05/09/2019, 06/27/2019)    Time  6    Period  Weeks    Status  Partially Met    Target Date  08/09/19      PT LONG TERM GOAL #2   Title  Demonstrate improved FOTO score by 10 units to demonstrate improvement in overall condition and self-reported functional ability.     Baseline  FOTO = 26 (03/14/2019); FOTO = 24 (04/06/2019); FOTO = 22 (05/09/2019); FOTO = 22 (06/27/2019):    Time  6    Period  Weeks    Status  Not Met    Target Date  08/09/19      PT LONG TERM GOAL #3   Title  Patient will demonstrate 0 degrees AROM knee extension without pain to improve function for weight bearing activities.     Baseline  lacking 5 degrees painful (03/14/2019); lacking 5 degrees painful (04/06/2019); lacking 5 degrees mildly painful ( 05/09/2019); lacking 10 degrees mildy painful (06/27/2019)    Time  6    Period  Weeks    Status  Not Met    Target Date  08/09/19      PT LONG TERM GOAL #4   Title  Patient will demonstrate R knee flexion strength in prone at least 4+/5 with no pain with tibia internally rotated  to improve patient's ability to complete functional activities such as stepping, swing phase of gait.     Baseline  painful 4/5 (03/14/2019); painful 4/5 (04/06/2019); painful (6/10)  4+/5 (05/09/2019); painful (6/10) 3/5.    Time  6    Period  Weeks    Status  Not Met    Target Date  08/09/19      PT LONG TERM GOAL #5   Title  Complete community, work and/or recreational activities without limitation due to current condition.     Baseline  difficulty walking, bending, kneeling, being active, sitting following walking, getting up and down from floor, activities requiring use of R LE (03/14/2019, 04/06/2019); improving feels 40% better per pt report (05/09/2019); states he feels 60% better since starting physical therapy (06/27/2019);    Time  6    Period  Weeks    Status  Not Met    Target Date  08/09/19        Plan - 07/31/19 1415     Clinical Impression Statement  Patient attended 15 physical therapy sessions over 4.5 months and overall did not make significant progress towards goals. Patient had difficulty with attendance related to work issues and comorbidities. After repeated education on the importance of attendance and communication about his availability, patient was unable to improve attendance consistently. Over the course of his care, patient consistently showed within session improvements to various treatment approaches but continued to have difficulty between sessions, ultimately showing regression in some goals by end of episode of care. Patient is now being discharged due to lack of progress in goals as well as lack of progress in attendance and participation. Patient was educated at recent visit on advice to  seek advice from physician about other options for care that he is better able to participate in.    Personal Factors and Comorbidities  Comorbidity 3+    Comorbidities   pituitary macroadenoma (currently being successfully treated with medication, benign), hypogonadotropic hypogonadism syndrome, partial hypopituitarism, fatty liver, benign cyst of R kidney, pre-diabetes, appendectomy, biliary dyskinesia, allergic rhinitis, sensitivity to latex, obesity.     Examination-Activity Limitations  Squat;Stand;Sit;Carry;Lift;Locomotion Level;Other;Bend    Examination-Participation Restrictions  Yard Work;Other;Community Activity;Interpersonal Relationship    Rehab Potential  Good    PT Frequency  2x / week    PT Duration  6 weeks    PT Treatment/Interventions  ADLs/Self Care Home Management;Cryotherapy;Moist Heat;Gait training;Functional mobility training;Therapeutic activities;Therapeutic exercise;Balance training;Neuromuscular re-education;Patient/family education;Manual techniques;Passive range of motion;Dry needling;Taping;Spinal Manipulations;Joint Manipulations    PT Next Visit Plan  Patient is now discharged from  physical therapy due to lack of consistent participation or progress towards goals.    PT Home Exercise Plan  Medbridge Access Code: [email or web address]     Recommended Other Services  follow up with physician for further meidcal assessment of condition.    Consulted and Agree with Plan of Care  Patient       Patient will benefit from skilled therapeutic intervention in order to improve the following deficits and impairments:  Abnormal gait, Decreased activity tolerance, Decreased endurance, Decreased range of motion, Decreased strength, Impaired perceived functional ability, Pain, Obesity, Increased muscle spasms, Difficulty walking, Decreased mobility  Visit Diagnosis: Difficulty in walking, not elsewhere classified  Right knee pain, unspecified chronicity  Stiffness of right knee, not elsewhere classified     Problem List Patient Active Problem List   Diagnosis Date Noted  . Sinusitis 07/11/2018  . Prediabetes 02/16/2018  . Arthritis 01/31/2018  . Fatty liver 01/31/2018  . Allergic rhinitis 01/31/2018  . 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 Biliary dyskinesia 03/10/2017  . Pain of upper abdomen   . 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 Neck nodule 06/12/2013  . Glucose intolerance (impaired glucose tolerance) 06/08/2013  . Male hypogonadism 06/08/2013  . Hyperprolactinemia ([place]) 08/19/2011  . Invasive pituitary adenoma ([place]) 08/19/2011  . 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 Graylon Good, PT, DPT 07/31/19, 2:16 PM  [facility] [address], [place], [address] Phone: [phone]   Fax:  [phone]  Name: Timothy Atkins MRN: [id number] Date of Birth: 1966-04-04

## 2020-03-07 ENCOUNTER — Ambulatory Visit: Payer: BC Managed Care – PPO

## 2020-03-23 ENCOUNTER — Ambulatory Visit: Payer: BC Managed Care – PPO | Attending: Internal Medicine

## 2020-03-23 DIAGNOSIS — Z23 Encounter for immunization: Secondary | ICD-10-CM

## 2020-03-23 NOTE — Progress Notes (Signed)
   Covid-19 Vaccination Clinic  Name:  Ratana Flood    MRN: [id number] DOB: 09-08-1966  03/23/2020  Mr. Boehringer was observed post Covid-19 immunization for 15 minutes without incident. He was provided with Vaccine Information Sheet and instruction to access the V-Safe system.   Mr. Ozaeta was instructed to call 911 with any severe reactions post vaccine: Marland Kitchen Difficulty breathing  . Swelling of face and throat  . A fast heartbeat  . A bad rash all over body  . Dizziness and weakness   Immunizations Administered    Name Date Dose VIS Date Route   Pfizer COVID-19 Vaccine 03/23/2020 10:37 AM 0.3 mL 12/20/2018 Intramuscular   Manufacturer: [employer], [employer]   Lot: [id number]   [place]: [id number]

## 2020-03-29 ENCOUNTER — Other Ambulatory Visit: Payer: Self-pay | Admitting: Internal Medicine

## 2020-03-29 DIAGNOSIS — J329 Chronic sinusitis, unspecified: Secondary | ICD-10-CM

## 2020-03-29 MED ORDER — CETIRIZINE HCL 10 MG PO TABS: 10.0000 mg | ORAL_TABLET | Freq: Every day | ORAL | 3 refills | Status: DC

## 2020-04-15 ENCOUNTER — Ambulatory Visit: Payer: BC Managed Care – PPO | Attending: Internal Medicine

## 2020-04-15 DIAGNOSIS — Z23 Encounter for immunization: Secondary | ICD-10-CM

## 2020-04-15 NOTE — Progress Notes (Signed)
   Covid-19 Vaccination Clinic  Name:  Timothy Atkins    MRN: [id number] DOB: 1965-11-24  04/15/2020  Timothy Atkins was observed post Covid-19 immunization for 15 minutes without incident. He was provided with Vaccine Information Sheet and instruction to access the V-Safe system.   Timothy Atkins was instructed to call 911 with any severe reactions post vaccine: Marland Kitchen Difficulty breathing  . Swelling of face and throat  . A fast heartbeat  . A bad rash all over body  . Dizziness and weakness   Immunizations Administered    Name Date Dose VIS Date Route   Pfizer COVID-19 Vaccine 04/15/2020 10:57 AM 0.3 mL 12/20/2018 Intramuscular   Manufacturer: [place]   Lot: [id number]   [place]: [address]-3

## 2020-07-19 ENCOUNTER — Other Ambulatory Visit: Payer: Self-pay | Admitting: Internal Medicine

## 2020-07-19 DIAGNOSIS — J329 Chronic sinusitis, unspecified: Secondary | ICD-10-CM

## 2020-07-19 MED ORDER — FLUTICASONE PROPIONATE 50 MCG/ACT NA SUSP: 2.0000 | Freq: Every day | NASAL | 2 refills | Status: DC

## 2021-03-26 ENCOUNTER — Other Ambulatory Visit: Payer: Self-pay | Admitting: Internal Medicine

## 2021-03-26 DIAGNOSIS — J329 Chronic sinusitis, unspecified: Secondary | ICD-10-CM

## 2021-04-29 ENCOUNTER — Other Ambulatory Visit: Payer: Self-pay | Admitting: Internal Medicine

## 2021-04-29 DIAGNOSIS — J329 Chronic sinusitis, unspecified: Secondary | ICD-10-CM

## 2021-07-08 ENCOUNTER — Other Ambulatory Visit: Payer: Self-pay | Admitting: Internal Medicine

## 2021-07-08 DIAGNOSIS — J329 Chronic sinusitis, unspecified: Secondary | ICD-10-CM

## 2021-08-02 DIAGNOSIS — E669 Obesity, unspecified: Secondary | ICD-10-CM | POA: Insufficient documentation

## 2021-08-02 DIAGNOSIS — IMO0002 Reserved for concepts with insufficient information to code with codable children: Secondary | ICD-10-CM | POA: Insufficient documentation

## 2021-08-02 DIAGNOSIS — E1165 Type 2 diabetes mellitus with hyperglycemia: Secondary | ICD-10-CM | POA: Insufficient documentation

## 2021-08-02 DIAGNOSIS — E559 Vitamin D deficiency, unspecified: Secondary | ICD-10-CM | POA: Insufficient documentation

## 2021-08-25 ENCOUNTER — Telehealth: Payer: Self-pay | Admitting: Internal Medicine

## 2021-08-25 NOTE — Telephone Encounter (Signed)
Pharmacy sent in a fax stating the Patient requesting azelastine nasal spray refills. States this has never been prescribed for the Patient at their pharmacy.   This is not on the Patient's medication list. Will need to call and clarify with the Patient.

## 2021-08-26 NOTE — Telephone Encounter (Signed)
Left message to return call 

## 2021-08-28 NOTE — Telephone Encounter (Signed)
Left message to return call. Unable to contact Patient

## 2021-10-31 ENCOUNTER — Other Ambulatory Visit: Payer: Self-pay | Admitting: Internal Medicine

## 2021-10-31 DIAGNOSIS — R109 Unspecified abdominal pain: Secondary | ICD-10-CM

## 2021-11-11 ENCOUNTER — Ambulatory Visit: Payer: BC Managed Care – PPO

## 2021-11-12 ENCOUNTER — Ambulatory Visit
Admission: RE | Admit: 2021-11-12 | Discharge: 2021-11-12 | Disposition: A | Discharge status: Home / Self Care | Payer: BC Managed Care – PPO | Source: Ambulatory Visit | Attending: Internal Medicine | Admitting: Internal Medicine

## 2021-11-12 ENCOUNTER — Other Ambulatory Visit: Payer: Self-pay

## 2021-11-12 DIAGNOSIS — R109 Unspecified abdominal pain: Secondary | ICD-10-CM | POA: Diagnosis present

## 2021-11-27 ENCOUNTER — Ambulatory Visit: Payer: BC Managed Care – PPO | Admitting: Surgery

## 2021-12-02 ENCOUNTER — Ambulatory Visit (INDEPENDENT_AMBULATORY_CARE_PROVIDER_SITE_OTHER): Payer: BC Managed Care – PPO | Admitting: Surgery

## 2021-12-02 ENCOUNTER — Other Ambulatory Visit: Payer: Self-pay

## 2021-12-02 ENCOUNTER — Ambulatory Visit: Payer: Self-pay | Admitting: Surgery

## 2021-12-02 ENCOUNTER — Encounter: Payer: Self-pay | Admitting: Surgery

## 2021-12-02 VITALS — BP 131/83 | HR 68 | Temp 98.3°F | Ht 70.0 in | Wt 256.4 lb

## 2021-12-02 DIAGNOSIS — K8010 Calculus of gallbladder with chronic cholecystitis without obstruction: Secondary | ICD-10-CM | POA: Insufficient documentation

## 2021-12-02 NOTE — H&P (View-Only) (Signed)
Patient ID: Timothy Atkins, male   DOB: 28-Aug-1966, 56 y.o.   MRN: [id number]  Chief Complaint: Gallstones, right upper quadrant pressure and nausea.  History of Present Illness Timothy Atkins is a 56 y.o. male with history of biliary dyskinesia previously diagnosed, and treated holistically.  It seems like its been 2 years since, and now he has increasing frequency of right upper quadrant pressure, that is primarily postprandial.  He also has associated nausea without vomiting.  He denies fevers and chills, denies diarrhea, but reports constipation despite eating fiber rich foods.  Exacerbating features include eating, he has been avoiding fried foods.    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 3 severe episodes over 3 weeks      Past Surgical History:  Procedure Laterality Date   APPENDECTOMY  as child   TONSILLECTOMY  as child    No Known Allergies  Current Outpatient Medications  Medication Sig Dispense Refill   cabergoline (DOSTINEX) 0.5 MG tablet Take 0.5 mg by mouth 2 (two) times a week.     celecoxib (CELEBREX) 200 MG  capsule Take 200 mg by mouth daily as needed.     cetirizine (ZYRTEC) 10 MG tablet TAKE 1 TABLET BY MOUTH AT BEDTIME 90 tablet 3   fluticasone (FLONASE) 50 MCG/ACT nasal spray USE 2 SPRAYS IN BOTH NOSTRILS DAILY AS NEEDED. NEED APPOINTMENT FOR FURTHER REFILLS 16 g 2   human chorionic gonadotropin (PREGNYL/NOVAREL) 10000 units injection Inject 3,000 Units into the muscle 3 (three) times a week.      mupirocin nasal ointment (BACTROBAN NASAL) 2 % Place 1 application into the nose 2 (two) times daily. Use one-half of tube in each nostril twice daily for five (5) days. After application, press sides of nose together and gently massage. 10 g 0   ondansetron (ZOFRAN) 4 MG tablet Take 1 tablet (4 mg total) by mouth daily as needed. 10 tablet 0   No current facility-administered medications for this visit.    Family History Family History  Problem Relation Age of Onset   Healthy Mother    Diabetes Mother    Heart disease Mother    Hyperlipidemia Mother    Hypertension Mother    Healthy Father    Asthma Father    COPD Father    Diabetes Father    Heart disease Father    Depression Brother    Diabetes Brother    Hyperlipidemia Brother    Hypertension Brother       Social History Social History   Tobacco Use  Smoking status: Never   Smokeless tobacco: Never  Vaping Use   Vaping Use: Never used  Substance Use Topics   Alcohol use: No   Drug use: No        Review of Systems  Constitutional:  Positive for malaise/fatigue.  HENT: Negative.    Eyes:  Positive for double vision.  Respiratory: Negative.    Cardiovascular: Negative.   Gastrointestinal:  Positive for abdominal pain, constipation and nausea.  Genitourinary: Negative.   Skin: Negative.   Neurological:  Positive for dizziness and headaches.  Psychiatric/Behavioral: Negative.       Physical Exam Blood pressure 131/83, pulse 68, temperature 98.3 F (36.8 C), temperature source Oral, height 5\' 10"  (1.778 m), weight  256 lb 6.4 oz (116.3 kg), SpO2 96 %. Last Weight  Most recent update: 12/02/2021 10:44 AM    Weight  116.3 kg (256 lb 6.4 oz)             CONSTITUTIONAL: Well developed, and nourished, obese male, appropriately responsive and aware without distress.   EYES: Sclera non-icteric.   EARS, NOSE, MOUTH AND THROAT: Mask worn.  Hearing is intact to voice.  NECK: Trachea is midline, and there is no jugular venous distension.  LYMPH NODES:  Lymph nodes in the neck are not enlarged. RESPIRATORY:  Lungs are clear, and breath sounds are equal bilaterally. Normal respiratory effort without pathologic use of accessory muscles. CARDIOVASCULAR: Heart is regular in rate and rhythm. GI: The abdomen is  soft, nontender, and nondistended. There were no palpable masses. I did not appreciate hepatosplenomegaly. There were normal bowel sounds. MUSCULOSKELETAL:  Symmetrical muscle tone appreciated in all four extremities.    SKIN: Skin turgor is normal. No pathologic skin lesions appreciated.  NEUROLOGIC:  Motor and sensation appear grossly normal.  Cranial nerves are grossly without defect. PSYCH:  Alert and oriented to person, place and time. Affect is appropriate for situation.  Data Reviewed I have personally reviewed what is currently available of the patient's imaging, recent labs and medical records.   Labs:  CBC Latest Ref Rng & Units 06/02/2018 02/17/2018 03/01/2017  WBC 3.8 - 10.6 K/uL 6.9 7.2 14.2(H)  Hemoglobin 13.0 - 18.0 g/dL 14.5 13.8 16.3  Hematocrit 40.0 - 52.0 % 41.5 39.5 47.3  Platelets 150 - 440 K/uL 232 215.0 284   CMP Latest Ref Rng & Units 06/02/2018 02/17/2018 03/01/2017  Glucose 70 - 99 mg/dL 162(H) 104(H) 147(H)  BUN 6 - 20 mg/dL 15 20 20   Creatinine 0.61 - 1.24 mg/dL 0.94 0.82 0.95  Sodium 135 - 145 mmol/L 139 138 135  Potassium 3.5 - 5.1 mmol/L 3.7 4.2 4.3  Chloride 98 - 111 mmol/L 105 106 105  CO2 22 - 32 mmol/L 26 26 23   Calcium 8.9 - 10.3 mg/dL 9.2 9.2 9.3  Total Protein 6.5 - 8.1  g/dL 7.7 6.6 7.8  Total Bilirubin 0.3 - 1.2 mg/dL 1.0 0.5 0.8  Alkaline Phos 38 - 126 U/L 53 57 70  AST 15 - 41 U/L 26 14 29   ALT 0 - 44 U/L 27 20 38   He has more recent lab work reported from November 04, 2021 and completed at [facility].  Notable is glucose of 142, CBC is unremarkable cholesterol profile notable for a 1 LDL 105.  Hemoglobin A1c of 7.2.  And his LFTs are all within normal limits, white blood cell count within normal limits hemoglobin and hematocrit within normal limits and platelets 249,000.   Imaging: Radiology review:  CLINICAL DATA:  Abdominal pain, unspecified abdominal location   EXAM: ABDOMEN ULTRASOUND COMPLETE   COMPARISON:  CT abdomen pelvis 03/01/2017   FINDINGS: Gallbladder: There are multiple intraluminal stones, largest measuring 1.8 cm. No wall thickening or pericholecystic fluid. Negative sonographic Murphy sign.   Common bile duct: Diameter: 3.5 mm   Liver: Diffusely increased liver echogenicity. Portal vein is patent on color Doppler imaging with normal direction of blood flow towards the liver.   IVC: No abnormality visualized.   Pancreas: Not visualized due to overlying bowel gas.   Spleen: Size and appearance within normal limits.   Right Kidney: Length: 10.8 cm. There is a simple cyst measuring 2.7 x 2.0 x 3.1 cm.   Left Kidney: Length: 9.9 cm. Echogenicity within normal limits. No mass or hydronephrosis visualized.   Abdominal aorta: No aneurysm visualized.   Other findings: None.   IMPRESSION: Cholelithiasis.  No evidence of acute cholecystitis.   Hepatic steatosis.   Simple right renal cyst measuring up to 3.1 cm.     Electronically Signed   By: Maurine Simmering M.D.   On: 11/12/2021 14:46 Within last 24 hrs: No results found.  Assessment    Chronic calculus cholecystitis with biliary colic  Patient Active Problem List   Diagnosis Date Noted   Obesity 08/02/2021   Type 2 or unspecified type diabetes mellitus,  uncontrolled 08/02/2021   Vitamin D deficiency 08/02/2021   Esophageal reflux 03/17/2019   Hyperlipidemia 03/17/2019   Pain in joint, lower leg 03/17/2019   Sinusitis 07/11/2018   Prediabetes 02/16/2018   Arthritis 01/31/2018   Fatty liver 01/31/2018   Allergic rhinitis 01/31/2018   Pituitary macroadenoma ([place]) 01/31/2018   Biliary dyskinesia 03/10/2017   Pain of upper abdomen    Hypogonadotropic hypogonadism syndrome, 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  This was discussed thoroughly.  Optimal plan is for robotic cholecystectomy.  Risks and benefits have been discussed with the patient which include but are not limited to anesthesia, bleeding, infection, biliary ductal injury or stenosis, other associated unanticipated injuries affiliated with laparoscopic surgery.  I believe there is the desire to proceed, interpreter utilized as needed.  Questions elicited and answered to satisfaction.  No guarantees ever expressed or implied.   Face-to-face time spent with the patient and accompanying care providers(if present) was 45 minutes, with more than 50% of the time spent counseling, educating, and coordinating care of the patient.    These notes generated with voice recognition software. I apologize for typographical errors.  Ronny Bacon M.D., FACS 12/02/2021, 11:43 AM

## 2021-12-02 NOTE — Patient Instructions (Addendum)
Our surgery scheduler will call you within 24-48 hours to schedule your surgery. Please have the [place] surgery sheet available when speaking with her.  Minimally Invasive Cholecystectomy Minimally invasive cholecystectomy is surgery to remove the gallbladder. The gallbladder is a pear-shaped organ that lies beneath the liver on the right side of the body. The gallbladder stores bile, which is a fluid that helps the body digest fats. Cholecystectomy is often done to treat inflammation (irritation and swelling) of the gallbladder (cholecystitis). This condition is usually caused by a buildup of gallstones (cholelithiasis) in the gallbladder or when the fluid in the gall bladder becomes stagnant because gallstones get stuck in the ducts (tubes) and block the flow of bile. This can result in inflammation and pain. In severe cases, emergency surgery may be required. This procedure is done through small incisions in the abdomen, instead of one large incision. It is also called laparoscopic surgery. A thin scope with a camera (laparoscope) is inserted through one incision. Then surgical instruments are inserted through the other incisions. In some cases, a minimally invasive surgery may need to be changed to a surgery that is done through a larger incision. This is called open surgery. Tell a health care provider about: Any allergies you have. All medicines you are taking, including vitamins, herbs, eye drops, creams, and over-the-counter medicines. Any problems you or family members have had with anesthetic medicines. Any bleeding problems you have. Any surgeries you have had. Any medical conditions you have. Whether you are pregnant or may be pregnant. What are the risks? Generally, this is a safe procedure. However, problems may occur, including: Infection. Bleeding. Allergic reactions to medicines. Damage to nearby structures or organs. A gallstone remaining in the common bile duct. The common bile  duct carries bile from the gallbladder to the small intestine. A bile leak from the liver or cystic duct after your gallbladder is removed. What happens before the procedure? When to stop eating and drinking Follow instructions from your health care provider about what you may eat and drink before your procedure. These may include: 8 hours before the procedure Stop eating most foods. Do not eat meat, fried foods, or fatty foods. Eat only light foods, such as toast or crackers. All liquids are okay except energy drinks and alcohol. 6 hours before the procedure Stop eating. Drink only clear liquids, such as water, clear fruit juice, black coffee, plain tea, and sports drinks. Do not drink energy drinks or alcohol. 2 hours before the procedure Stop drinking all liquids. You may be allowed to take medicines with small sips of water. If you do not follow your health care provider's instructions, your procedure may be delayed or canceled. Medicines Ask your health care provider about: Changing or stopping your regular medicines. This is especially important if you are taking diabetes medicines or blood thinners. Taking medicines such as aspirin and ibuprofen. These medicines can thin your blood. Do not take these medicines unless your health care provider tells you to take them. Taking over-the-counter medicines, vitamins, herbs, and supplements. General instructions If you will be going home right after the procedure, plan to have a responsible adult: Take you home from the hospital or clinic. You will not be allowed to drive. Care for you for the time you are told. Do not use any products that contain nicotine or tobacco for at least 4 weeks before the procedure. These products include cigarettes, chewing tobacco, and vaping devices, such as e-cigarettes. If you need help quitting,  ask your health care provider. Ask your health care provider: How your surgery site will be marked. What steps  will be taken to help prevent infection. These may include: Removing hair at the surgery site. Washing skin with a germ-killing soap. Taking antibiotic medicine. What happens during the procedure?  An IV will be inserted into one of your veins. You will be given one or both of the following: A medicine to help you relax (sedative). A medicine to make you fall asleep (general anesthetic). Your surgeon will make several small incisions in your abdomen. The laparoscope will be inserted through one of the small incisions. The camera on the laparoscope will send images to a monitor in the operating room. This lets your surgeon see inside your abdomen. A gas will be pumped into your abdomen. This will expand your abdomen to give the surgeon more room to perform the surgery. Other tools that are needed for the procedure will be inserted through the other incisions. The gallbladder will be removed through one of the incisions. Your common bile duct may be examined. If stones are found in the common bile duct, they may be removed. After your gallbladder has been removed, the incisions will be closed with stitches (sutures), staples, or skin glue. Your incisions will be covered with a bandage (dressing). The procedure may vary among health care providers and hospitals. What happens after the procedure? Your blood pressure, heart rate, breathing rate, and blood oxygen level will be monitored until you leave the hospital or clinic. You will be given medicines as needed to control your pain. You may have a drain placed in the incision. The drain will be removed a day or two after the procedure. Summary Minimally invasive cholecystectomy, also called laparoscopic cholecystectomy, is surgery to remove the gallbladder using small incisions. Tell your health care provider about all the medical conditions you have and all the medicines you are taking for those conditions. Before the procedure, follow  instructions about when to stop eating and drinking and changing or stopping medicines. Plan to have a responsible adult care for you for the time you are told after you leave the hospital or clinic. This information is not intended to replace advice given to you by your health care provider. Make sure you discuss any questions you have with your health care provider. Document Revised: 04/15/2021 Document Reviewed: 04/15/2021 Elsevier Patient Education  2022 [place]. Cholelithiasis Cholelithiasis happens when gallstones form in the gallbladder. The gallbladder stores bile. Bile is a fluid that helps digest fats. Bile can harden and form into gallstones. If they cause a blockage, they can cause pain (gallbladder attack). What are the causes? This condition may be caused by: Some blood diseases, such as sickle cell anemia. Too much of a fat-like substance (cholesterol) in your bile. Not enough bile salts in your bile. These salts help the body absorb and digest fats. The gallbladder not emptying fully or often enough. This is common in pregnant women. What increases the risk? The following factors may make you more likely to develop this condition: Being male. Being pregnant many times. Eating a lot of fried foods, fat, and refined carbs (refined carbohydrates). Being very overweight (obese). Being older than age 78. Using medicines with male hormones in them for a long time. Losing weight fast. Having gallstones in your family. Having some health problems, such as diabetes, Crohn's disease, or liver disease. What are the signs or symptoms? Often, there may be gallstones but no symptoms.  These gallstones are called silent gallstones. If a gallstone causes a blockage, you may get sudden pain. The pain: Can be in the upper right part of your belly (abdomen). Normally comes at night or after you eat. Can last an hour or more. Can spread to your right shoulder, back, or chest. Can  feel like discomfort, burning, or fullness in the upper part of your belly (indigestion). If the blockage lasts more than a few hours, you can get an infection or swelling. You may: Feel like you may vomit. Vomit. Feel bloated. Have belly pain for 5 hours or more. Feel tender in your belly, often in the upper right part and under your ribs. Have fever or chills. Have skin or the white parts of your eyes turn yellow (jaundice). Have dark pee (urine) or pale poop (stool). How is this treated? Treatment for this condition depends on how bad you feel. If you have symptoms, you may need: Home care, if symptoms are not very bad. Do not eat for 12-24 hours. Drink only water and clear liquids. Start to eat simple or clear foods after 1 or 2 days. Try broths and crackers. You may need medicines for pain or stomach upset or both. If you have an infection, you will need antibiotics. A hospital stay, if you have very bad pain or a very bad infection. Surgery to remove your gallbladder. You may need this if: Gallstones keep coming back. You have very bad symptoms. Medicines to break up gallstones. Medicines: Are best for small gallstones. May be used for up to 6-12 months. A procedure to find and take out gallstones or to break up gallstones. Follow these instructions at home: Medicines Take over-the-counter and prescription medicines only as told by your doctor. If you were prescribed an antibiotic medicine, take it as told by your doctor. Do not stop taking the antibiotic even if you start to feel better. Ask your doctor if the medicine prescribed to you requires you to avoid driving or using machinery. Eating and drinking Drink enough fluid to keep your urine pale yellow. Drink water or clear fluids. This is important when you have pain. Eat healthy foods. Choose: Fewer fatty foods, such as fried foods. Fewer refined carbs. Avoid breads and grains that are highly processed, such as white  bread and white rice. Choose whole grains, such as whole-wheat bread and brown rice. More fiber. Almonds, fresh fruit, and beans are healthy sources. General instructions Keep a healthy weight. Keep all follow-up visits as told by your doctor. This is important. Where to find more information [employer] of Diabetes and Digestive and Kidney Diseases: [email or web address] Contact a doctor if: You have sudden pain in the upper right part of your belly. Pain might spread to your right shoulder, back, or chest. You have been diagnosed with gallstones that have no symptoms and you get: Belly pain. Discomfort, burning, or fullness in the upper part of your abdomen. You have dark urine or pale stools. Get help right away if: You have sudden pain in the upper right part of your abdomen, and the pain lasts more than 2 hours. You have pain in your abdomen, and: It lasts more than 5 hours. It keeps getting worse. You have a fever or chills. You keep feeling like you may vomit. You keep vomiting. Your skin or the white parts of your eyes turn yellow. Summary Cholelithiasis happens when gallstones form in the gallbladder. This condition may be caused by a blood disease, too much  of a fat-like substance in the bile, or not enough bile salts in bile. Treatment for this condition depends on how bad you feel. If you have symptoms, do not eat or drink. You may need medicines. You may need a hospital stay for very bad pain or a very bad infection. You may need surgery if gallstones keep coming back or if you have very bad symptoms. This information is not intended to replace advice given to you by your health care provider. Make sure you discuss any questions you have with your health care provider. Document Revised: 12/01/2019 Document Reviewed: 09/04/2019 Elsevier Patient Education  2022 [place] If you have a gallbladder condition, you may have trouble digesting  fats. Eating a low-fat diet can help reduce your symptoms, and may be helpful before and after having surgery to remove your gallbladder (cholecystectomy). Your health care provider may recommend that you work with a diet and nutrition specialist (dietitian) to help you reduce the amount of fat in your diet. What are tips for following this plan? General guidelines Limit your fat intake to less than 30% of your total daily calories. If you eat around 1,800 calories each day, this is less than 60 grams (g) of fat per day. Fat is an important part of a healthy diet. Eating a low-fat diet can make it hard to maintain a healthy body weight. Ask your dietitian how much fat, calories, and other nutrients you need each day. Eat small, frequent meals throughout the day instead of three large meals. Drink at least 8-10 cups of fluid a day. Drink enough fluid to keep your urine clear or pale yellow. Limit alcohol intake to no more than 1 drink a day for nonpregnant women and 2 drinks a day for men. One drink equals 12 oz of beer, 5 oz of wine, or 1 oz of hard liquor. Reading food labels  Check Nutrition Facts on food labels for the amount of fat per serving. Choose foods with less than 3 grams of fat per serving. Shopping Choose nonfat and low-fat healthy foods. Look for the words "nonfat," "low fat," or "fat free." Avoid buying processed or prepackaged foods. Cooking Cook using low-fat methods, such as baking, broiling, grilling, or boiling. Cook with small amounts of healthy fats, such as olive oil, grapeseed oil, canola oil, or sunflower oil. What foods are recommended? All fresh, frozen, or canned fruits and vegetables. Whole grains. Low-fat or non-fat (skim) milk and yogurt. Lean meat, skinless poultry, fish, eggs, and beans. Low-fat protein supplement powders or drinks. Spices and herbs. What foods are not recommended? High-fat foods. These include baked goods, fast food, fatty cuts of meat,  ice cream, french toast, sweet rolls, pizza, cheese bread, foods covered with butter, creamy sauces, or cheese. Fried foods. These include french fries, tempura, battered fish, breaded chicken, fried breads, and sweets. Foods with strong odors. Foods that cause bloating and gas. Summary A low-fat diet can be helpful if you have a gallbladder condition, or before and after gallbladder surgery. Limit your fat intake to less than 30% of your total daily calories. This is about 60 g of fat if you eat 1,800 calories each day. Eat small, frequent meals throughout the day instead of three large meals. This information is not intended to replace advice given to you by your health care provider. Make sure you discuss any questions you have with your health care provider. Document Revised: 05/24/2020 Document Reviewed: 05/30/2020 Elsevier Patient Education  [place].

## 2021-12-02 NOTE — Progress Notes (Signed)
Patient ID: Timothy Atkins, male   DOB: 1966/01/15, 56 y.o.   MRN: [id number]  Chief Complaint: Gallstones, right upper quadrant pressure and nausea.  History of Present Illness Timothy Atkins is a 56 y.o. male with history of biliary dyskinesia previously diagnosed, and treated holistically.  It seems like its been 2 years since, and now he has increasing frequency of right upper quadrant pressure, that is primarily postprandial.  He also has associated nausea without vomiting.  He denies fevers and chills, denies diarrhea, but reports constipation despite eating fiber rich foods.  Exacerbating features include eating, he has been avoiding fried foods.    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 3 severe episodes over 3 weeks      Past Surgical History:  Procedure Laterality Date   APPENDECTOMY  as child   TONSILLECTOMY  as child    No Known Allergies  Current Outpatient Medications  Medication Sig Dispense Refill   cabergoline (DOSTINEX) 0.5 MG tablet Take 0.5 mg by mouth 2 (two) times a week.     celecoxib (CELEBREX) 200 MG  capsule Take 200 mg by mouth daily as needed.     cetirizine (ZYRTEC) 10 MG tablet TAKE 1 TABLET BY MOUTH AT BEDTIME 90 tablet 3   fluticasone (FLONASE) 50 MCG/ACT nasal spray USE 2 SPRAYS IN BOTH NOSTRILS DAILY AS NEEDED. NEED APPOINTMENT FOR FURTHER REFILLS 16 g 2   human chorionic gonadotropin (PREGNYL/NOVAREL) 10000 units injection Inject 3,000 Units into the muscle 3 (three) times a week.      mupirocin nasal ointment (BACTROBAN NASAL) 2 % Place 1 application into the nose 2 (two) times daily. Use one-half of tube in each nostril twice daily for five (5) days. After application, press sides of nose together and gently massage. 10 g 0   ondansetron (ZOFRAN) 4 MG tablet Take 1 tablet (4 mg total) by mouth daily as needed. 10 tablet 0   No current facility-administered medications for this visit.    Family History Family History  Problem Relation Age of Onset   Healthy Mother    Diabetes Mother    Heart disease Mother    Hyperlipidemia Mother    Hypertension Mother    Healthy Father    Asthma Father    COPD Father    Diabetes Father    Heart disease Father    Depression Brother    Diabetes Brother    Hyperlipidemia Brother    Hypertension Brother       Social History Social History   Tobacco Use  Smoking status: Never   Smokeless tobacco: Never  Vaping Use   Vaping Use: Never used  Substance Use Topics   Alcohol use: No   Drug use: No        Review of Systems  Constitutional:  Positive for malaise/fatigue.  HENT: Negative.    Eyes:  Positive for double vision.  Respiratory: Negative.    Cardiovascular: Negative.   Gastrointestinal:  Positive for abdominal pain, constipation and nausea.  Genitourinary: Negative.   Skin: Negative.   Neurological:  Positive for dizziness and headaches.  Psychiatric/Behavioral: Negative.       Physical Exam Blood pressure 131/83, pulse 68, temperature 98.3 F (36.8 C), temperature source Oral, height 5\' 10"  (1.778 m), weight  256 lb 6.4 oz (116.3 kg), SpO2 96 %. Last Weight  Most recent update: 12/02/2021 10:44 AM    Weight  116.3 kg (256 lb 6.4 oz)             CONSTITUTIONAL: Well developed, and nourished, obese male, appropriately responsive and aware without distress.   EYES: Sclera non-icteric.   EARS, NOSE, MOUTH AND THROAT: Mask worn.  Hearing is intact to voice.  NECK: Trachea is midline, and there is no jugular venous distension.  LYMPH NODES:  Lymph nodes in the neck are not enlarged. RESPIRATORY:  Lungs are clear, and breath sounds are equal bilaterally. Normal respiratory effort without pathologic use of accessory muscles. CARDIOVASCULAR: Heart is regular in rate and rhythm. GI: The abdomen is  soft, nontender, and nondistended. There were no palpable masses. I did not appreciate hepatosplenomegaly. There were normal bowel sounds. MUSCULOSKELETAL:  Symmetrical muscle tone appreciated in all four extremities.    SKIN: Skin turgor is normal. No pathologic skin lesions appreciated.  NEUROLOGIC:  Motor and sensation appear grossly normal.  Cranial nerves are grossly without defect. PSYCH:  Alert and oriented to person, place and time. Affect is appropriate for situation.  Data Reviewed I have personally reviewed what is currently available of the patient's imaging, recent labs and medical records.   Labs:  CBC Latest Ref Rng & Units 06/02/2018 02/17/2018 03/01/2017  WBC 3.8 - 10.6 K/uL 6.9 7.2 14.2(H)  Hemoglobin 13.0 - 18.0 g/dL 14.5 13.8 16.3  Hematocrit 40.0 - 52.0 % 41.5 39.5 47.3  Platelets 150 - 440 K/uL 232 215.0 284   CMP Latest Ref Rng & Units 06/02/2018 02/17/2018 03/01/2017  Glucose 70 - 99 mg/dL 162(H) 104(H) 147(H)  BUN 6 - 20 mg/dL 15 20 20   Creatinine 0.61 - 1.24 mg/dL 0.94 0.82 0.95  Sodium 135 - 145 mmol/L 139 138 135  Potassium 3.5 - 5.1 mmol/L 3.7 4.2 4.3  Chloride 98 - 111 mmol/L 105 106 105  CO2 22 - 32 mmol/L 26 26 23   Calcium 8.9 - 10.3 mg/dL 9.2 9.2 9.3  Total Protein 6.5 - 8.1  g/dL 7.7 6.6 7.8  Total Bilirubin 0.3 - 1.2 mg/dL 1.0 0.5 0.8  Alkaline Phos 38 - 126 U/L 53 57 70  AST 15 - 41 U/L 26 14 29   ALT 0 - 44 U/L 27 20 38   He has more recent lab work reported from November 04, 2021 and completed at [facility].  Notable is glucose of 142, CBC is unremarkable cholesterol profile notable for a 1 LDL 105.  Hemoglobin A1c of 7.2.  And his LFTs are all within normal limits, white blood cell count within normal limits hemoglobin and hematocrit within normal limits and platelets 249,000.   Imaging: Radiology review:  CLINICAL DATA:  Abdominal pain, unspecified abdominal location   EXAM: ABDOMEN ULTRASOUND COMPLETE   COMPARISON:  CT abdomen pelvis 03/01/2017   FINDINGS: Gallbladder: There are multiple intraluminal stones, largest measuring 1.8 cm. No wall thickening or pericholecystic fluid. Negative sonographic Murphy sign.   Common bile duct: Diameter: 3.5 mm   Liver: Diffusely increased liver echogenicity. Portal vein is patent on color Doppler imaging with normal direction of blood flow towards the liver.   IVC: No abnormality visualized.   Pancreas: Not visualized due to overlying bowel gas.   Spleen: Size and appearance within normal limits.   Right Kidney: Length: 10.8 cm. There is a simple cyst measuring 2.7 x 2.0 x 3.1 cm.   Left Kidney: Length: 9.9 cm. Echogenicity within normal limits. No mass or hydronephrosis visualized.   Abdominal aorta: No aneurysm visualized.   Other findings: None.   IMPRESSION: Cholelithiasis.  No evidence of acute cholecystitis.   Hepatic steatosis.   Simple right renal cyst measuring up to 3.1 cm.     Electronically Signed   By: Maurine Simmering M.D.   On: 11/12/2021 14:46 Within last 24 hrs: No results found.  Assessment    Chronic calculus cholecystitis with biliary colic  Patient Active Problem List   Diagnosis Date Noted   Obesity 08/02/2021   Type 2 or unspecified type diabetes mellitus,  uncontrolled 08/02/2021   Vitamin D deficiency 08/02/2021   Esophageal reflux 03/17/2019   Hyperlipidemia 03/17/2019   Pain in joint, lower leg 03/17/2019   Sinusitis 07/11/2018   Prediabetes 02/16/2018   Arthritis 01/31/2018   Fatty liver 01/31/2018   Allergic rhinitis 01/31/2018   Pituitary macroadenoma ([place]) 01/31/2018   Biliary dyskinesia 03/10/2017   Pain of upper abdomen    Hypogonadotropic hypogonadism syndrome, 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  This was discussed thoroughly.  Optimal plan is for robotic cholecystectomy.  Risks and benefits have been discussed with the patient which include but are not limited to anesthesia, bleeding, infection, biliary ductal injury or stenosis, other associated unanticipated injuries affiliated with laparoscopic surgery.  I believe there is the desire to proceed, interpreter utilized as needed.  Questions elicited and answered to satisfaction.  No guarantees ever expressed or implied.   Face-to-face time spent with the patient and accompanying care providers(if present) was 45 minutes, with more than 50% of the time spent counseling, educating, and coordinating care of the patient.    These notes generated with voice recognition software. I apologize for typographical errors.  Ronny Bacon M.D., FACS 12/02/2021, 11:43 AM

## 2021-12-03 ENCOUNTER — Telehealth: Payer: Self-pay | Admitting: Surgery

## 2021-12-03 NOTE — Telephone Encounter (Signed)
Outgoing call is made, left message for patient to call, please inform patient of the following information regarding scheduled surgery:   Pre-Admission date/time, COVID Testing date and Surgery date.  Surgery Date: 12/17/21 Preadmission Testing Date: 12/09/21 (phone 8a-1p) Covid Testing Date: Not needed.     Also patient will need to call at [phone], between 1-3:00pm the day before surgery, to find out what time to arrive for surgery.

## 2021-12-03 NOTE — Telephone Encounter (Signed)
Patient calls back, he is aware of all dates regarding his surgery and verbalized understanding.

## 2021-12-09 ENCOUNTER — Other Ambulatory Visit: Payer: Self-pay

## 2021-12-09 ENCOUNTER — Other Ambulatory Visit
Admission: RE | Admit: 2021-12-09 | Discharge: 2021-12-09 | Disposition: A | Discharge status: Home / Self Care | Payer: BC Managed Care – PPO | Source: Ambulatory Visit | Attending: Surgery | Admitting: Surgery

## 2021-12-09 HISTORY — DX: Prediabetes: R73.03

## 2021-12-09 NOTE — Patient Instructions (Addendum)
Your procedure is scheduled on: Wednesday December 17, 2021. Report to Day Surgery inside [facility] 2nd floor, stop by admissions desk before getting on elevator.  To find out your arrival time please call [phone] between 1PM - 3PM on Tuesday December 16, 2021.  Remember: Instructions that are not followed completely may result in serious medical risk,  up to and including death, or upon the discretion of your surgeon and anesthesiologist your  surgery may need to be rescheduled.     _X__ 1. Do not eat food or drink fluids after midnight the night before your procedure.                 No chewing gum or hard candies.  __X__2.  On the morning of surgery brush your teeth with toothpaste and water, you                may rinse your mouth with mouthwash if you wish.  Do not swallow any toothpaste or mouthwash.     _X__ 3.  No Alcohol for 24 hours before or after surgery.   _X__ 4.  Do Not Smoke or use e-cigarettes For 24 Hours Prior to Your Surgery.                 Do not use any chewable tobacco products for at least 6 hours prior to                 Surgery.  _X__  5.  Do not use any recreational drugs (marijuana, cocaine, heroin, ecstasy, MDMA or other)                For at least one week prior to your surgery.  Combination of these drugs with anesthesia                May have life threatening results.  ____  6.  Bring all medications with you on the day of surgery if instructed.   __X__  7.  Notify your doctor if there is any change in your medical condition      (cold, fever, infections).     Do not wear jewelry, make-up, hairpins, clips or nail polish. Do not wear lotions, powders, or perfumes. You may wear deodorant. Do not shave 48 hours prior to surgery. Men may shave face and neck. Do not bring valuables to the hospital.    [facility] is not responsible for any belongings or valuables.  Contacts, dentures or bridgework may not be worn into  surgery. Leave your suitcase in the car. After surgery it may be brought to your room. For patients admitted to the hospital, discharge time is determined by your treatment team.   Patients discharged the day of surgery will not be allowed to drive home.   Make arrangements for someone to be with you for the first 24 hours of your Same Day Discharge.  __X__ Take these medicines the morning of surgery with A SIP OF WATER:    1. None   2.   3.   4.  5.  6.  ____ Fleet Enema (as directed)   __X__ Use Antibacterial Soap (or wipes) as directed  ____ Use Benzoyl Peroxide Gel as instructed  ____ Use inhalers on the day of surgery  __X__ Stop metformin 2 days prior to surgery     ____ Take 1/2 of usual insulin dose the night before surgery. No insulin the morning  of surgery.   ____ Call your PCP, cardiologist, or Pulmonologist if taking Coumadin/Plavix/aspirin and ask when to stop before your surgery.   __X__ One Week prior to surgery- Stop Anti-inflammatories such as Ibuprofen, Aleve, Advil, Motrin, meloxicam (MOBIC), diclofenac, etodolac, ketorolac, Toradol, Daypro, piroxicam, Goody's or BC powders. OK TO USE TYLENOL IF NEEDED   __X__ Stop supplements until after surgery.    ____ Bring C-Pap to the hospital.    If you have any questions regarding your pre-procedure instructions,  Please call Pre-admit Testing at [phone]

## 2021-12-17 ENCOUNTER — Other Ambulatory Visit: Payer: Self-pay

## 2021-12-17 ENCOUNTER — Ambulatory Visit: Payer: BC Managed Care – PPO | Admitting: Anesthesiology

## 2021-12-17 ENCOUNTER — Ambulatory Visit
Admission: RE | Admit: 2021-12-17 | Discharge: 2021-12-17 | Disposition: A | Discharge status: Home / Self Care | Payer: BC Managed Care – PPO | Attending: Surgery | Admitting: Surgery

## 2021-12-17 ENCOUNTER — Encounter: Payer: Self-pay | Admitting: Surgery

## 2021-12-17 ENCOUNTER — Encounter
Admission: RE | Disposition: A | Discharge status: Home / Self Care | Payer: Self-pay | Source: Home / Self Care | Attending: Surgery

## 2021-12-17 DIAGNOSIS — K8010 Calculus of gallbladder with chronic cholecystitis without obstruction: Secondary | ICD-10-CM

## 2021-12-17 DIAGNOSIS — E785 Hyperlipidemia, unspecified: Secondary | ICD-10-CM | POA: Diagnosis not present

## 2021-12-17 DIAGNOSIS — N281 Cyst of kidney, acquired: Secondary | ICD-10-CM | POA: Insufficient documentation

## 2021-12-17 DIAGNOSIS — Z86018 Personal history of other benign neoplasm: Secondary | ICD-10-CM | POA: Insufficient documentation

## 2021-12-17 DIAGNOSIS — K760 Fatty (change of) liver, not elsewhere classified: Secondary | ICD-10-CM | POA: Diagnosis not present

## 2021-12-17 DIAGNOSIS — K219 Gastro-esophageal reflux disease without esophagitis: Secondary | ICD-10-CM | POA: Diagnosis not present

## 2021-12-17 DIAGNOSIS — E119 Type 2 diabetes mellitus without complications: Secondary | ICD-10-CM | POA: Insufficient documentation

## 2021-12-17 SURGERY — CHOLECYSTECTOMY, ROBOT-ASSISTED, LAPAROSCOPIC
Anesthesia: General

## 2021-12-17 MED ORDER — ACETAMINOPHEN 500 MG PO TABS
ORAL_TABLET | ORAL | Status: AC
  Administered 2021-12-17: 1000 mg via ORAL
  Filled 2021-12-17: qty 2

## 2021-12-17 MED ORDER — CEFAZOLIN SODIUM-DEXTROSE 2-4 GM/100ML-% IV SOLN
2.0000 g | INTRAVENOUS | Status: AC
  Administered 2021-12-17: 2 g via INTRAVENOUS

## 2021-12-17 MED ORDER — CHLORHEXIDINE GLUCONATE 0.12 % MT SOLN: 15.0000 mL | Freq: Once | OROMUCOSAL | Status: AC

## 2021-12-17 MED ORDER — FENTANYL CITRATE (PF) 100 MCG/2ML IJ SOLN
25.0000 ug | INTRAMUSCULAR | Status: DC | PRN
  Administered 2021-12-17: 25 ug via INTRAVENOUS

## 2021-12-17 MED ORDER — CHLORHEXIDINE GLUCONATE 0.12 % MT SOLN
OROMUCOSAL | Status: AC
  Administered 2021-12-17: 15 mL via OROMUCOSAL
  Filled 2021-12-17: qty 15

## 2021-12-17 MED ORDER — FAMOTIDINE 20 MG PO TABS
ORAL_TABLET | ORAL | Status: AC
  Administered 2021-12-17: 20 mg via ORAL
  Filled 2021-12-17: qty 1

## 2021-12-17 MED ORDER — BUPIVACAINE-EPINEPHRINE (PF) 0.25% -1:200000 IJ SOLN
INTRAMUSCULAR | Status: AC
  Filled 2021-12-17: qty 30

## 2021-12-17 MED ORDER — OXYCODONE HCL 5 MG PO TABS: 5.0000 mg | ORAL_TABLET | Freq: Once | ORAL | Status: DC | PRN

## 2021-12-17 MED ORDER — ONDANSETRON HCL 4 MG/2ML IJ SOLN: 4.0000 mg | Freq: Once | INTRAMUSCULAR | Status: DC | PRN

## 2021-12-17 MED ORDER — DROPERIDOL 2.5 MG/ML IJ SOLN
0.6250 mg | Freq: Once | INTRAMUSCULAR | Status: AC
  Administered 2021-12-17: 0.625 mg via INTRAVENOUS
  Filled 2021-12-17: qty 2

## 2021-12-17 MED ORDER — FAMOTIDINE 20 MG PO TABS
20.0000 mg | ORAL_TABLET | Freq: Once | ORAL | Status: AC
Start: 2021-12-17 — End: 2021-12-17

## 2021-12-17 MED ORDER — FENTANYL CITRATE (PF) 250 MCG/5ML IJ SOLN
INTRAMUSCULAR | Status: AC
  Filled 2021-12-17: qty 5

## 2021-12-17 MED ORDER — LIDOCAINE HCL (CARDIAC) PF 100 MG/5ML IV SOSY
PREFILLED_SYRINGE | INTRAVENOUS | Status: DC | PRN
  Administered 2021-12-17: 80 mg via INTRAVENOUS

## 2021-12-17 MED ORDER — LACTATED RINGERS IV SOLN: INTRAVENOUS | Status: DC

## 2021-12-17 MED ORDER — GABAPENTIN 300 MG PO CAPS: 300.0000 mg | ORAL_CAPSULE | ORAL | Status: AC

## 2021-12-17 MED ORDER — ONDANSETRON HCL 4 MG/2ML IJ SOLN
INTRAMUSCULAR | Status: AC
  Filled 2021-12-17: qty 2

## 2021-12-17 MED ORDER — CHLORHEXIDINE GLUCONATE CLOTH 2 % EX PADS: 6.0000 | MEDICATED_PAD | Freq: Once | CUTANEOUS | Status: DC

## 2021-12-17 MED ORDER — ROCURONIUM BROMIDE 10 MG/ML (PF) SYRINGE
PREFILLED_SYRINGE | INTRAVENOUS | Status: AC
  Filled 2021-12-17: qty 10

## 2021-12-17 MED ORDER — ACETAMINOPHEN 10 MG/ML IV SOLN: 1000.0000 mg | Freq: Once | INTRAVENOUS | Status: DC | PRN

## 2021-12-17 MED ORDER — CELECOXIB 200 MG PO CAPS: 200.0000 mg | ORAL_CAPSULE | ORAL | Status: AC

## 2021-12-17 MED ORDER — SUGAMMADEX SODIUM 500 MG/5ML IV SOLN
INTRAVENOUS | Status: DC | PRN
  Administered 2021-12-17: 500 mg via INTRAVENOUS

## 2021-12-17 MED ORDER — MIDAZOLAM HCL 2 MG/2ML IJ SOLN
INTRAMUSCULAR | Status: AC
  Filled 2021-12-17: qty 2

## 2021-12-17 MED ORDER — DEXAMETHASONE SODIUM PHOSPHATE 10 MG/ML IJ SOLN
INTRAMUSCULAR | Status: DC | PRN
Start: 2021-12-17 — End: 2021-12-17
  Administered 2021-12-17: 10 mg via INTRAVENOUS

## 2021-12-17 MED ORDER — ONDANSETRON HCL 4 MG/2ML IJ SOLN
INTRAMUSCULAR | Status: DC | PRN
  Administered 2021-12-17: 4 mg via INTRAVENOUS

## 2021-12-17 MED ORDER — FENTANYL CITRATE (PF) 100 MCG/2ML IJ SOLN
INTRAMUSCULAR | Status: DC | PRN
  Administered 2021-12-17 (×2): 100 ug via INTRAVENOUS
  Administered 2021-12-17: 150 ug via INTRAVENOUS

## 2021-12-17 MED ORDER — HYDROCODONE-ACETAMINOPHEN 5-325 MG PO TABS: 1.0000 | ORAL_TABLET | Freq: Four times a day (QID) | ORAL | 0 refills | Status: DC | PRN

## 2021-12-17 MED ORDER — INDOCYANINE GREEN 25 MG IV SOLR
2.5000 mg | Freq: Once | INTRAVENOUS | Status: AC
  Administered 2021-12-17: 2.5 mg via INTRAVENOUS
  Filled 2021-12-17: qty 1

## 2021-12-17 MED ORDER — FENTANYL CITRATE (PF) 100 MCG/2ML IJ SOLN
INTRAMUSCULAR | Status: AC
  Filled 2021-12-17: qty 2

## 2021-12-17 MED ORDER — PROPOFOL 10 MG/ML IV BOLUS
INTRAVENOUS | Status: AC
  Filled 2021-12-17: qty 20

## 2021-12-17 MED ORDER — SUGAMMADEX SODIUM 500 MG/5ML IV SOLN
INTRAVENOUS | Status: AC
  Filled 2021-12-17: qty 5

## 2021-12-17 MED ORDER — GABAPENTIN 300 MG PO CAPS
ORAL_CAPSULE | ORAL | Status: AC
  Administered 2021-12-17: 300 mg via ORAL
  Filled 2021-12-17: qty 1

## 2021-12-17 MED ORDER — PROPOFOL 10 MG/ML IV BOLUS
INTRAVENOUS | Status: DC | PRN
  Administered 2021-12-17: 150 mg via INTRAVENOUS

## 2021-12-17 MED ORDER — ORAL CARE MOUTH RINSE: 15.0000 mL | Freq: Once | OROMUCOSAL | Status: AC

## 2021-12-17 MED ORDER — BUPIVACAINE-EPINEPHRINE (PF) 0.25% -1:200000 IJ SOLN
INTRAMUSCULAR | Status: DC | PRN
  Administered 2021-12-17: 30 mL

## 2021-12-17 MED ORDER — CELECOXIB 200 MG PO CAPS
ORAL_CAPSULE | ORAL | Status: AC
  Administered 2021-12-17: 200 mg via ORAL
  Filled 2021-12-17: qty 1

## 2021-12-17 MED ORDER — ACETAMINOPHEN 500 MG PO TABS: 1000.0000 mg | ORAL_TABLET | ORAL | Status: AC

## 2021-12-17 MED ORDER — DEXAMETHASONE SODIUM PHOSPHATE 10 MG/ML IJ SOLN
INTRAMUSCULAR | Status: AC
  Filled 2021-12-17: qty 1

## 2021-12-17 MED ORDER — LIDOCAINE HCL (PF) 2 % IJ SOLN
INTRAMUSCULAR | Status: AC
  Filled 2021-12-17: qty 5

## 2021-12-17 MED ORDER — CEFAZOLIN SODIUM-DEXTROSE 2-4 GM/100ML-% IV SOLN
INTRAVENOUS | Status: AC
  Filled 2021-12-17: qty 100

## 2021-12-17 MED ORDER — MIDAZOLAM HCL 2 MG/2ML IJ SOLN
INTRAMUSCULAR | Status: DC | PRN
  Administered 2021-12-17: 2 mg via INTRAVENOUS

## 2021-12-17 MED ORDER — BUPIVACAINE LIPOSOME 1.3 % IJ SUSP
INTRAMUSCULAR | Status: AC
  Filled 2021-12-17: qty 20

## 2021-12-17 MED ORDER — ROCURONIUM BROMIDE 100 MG/10ML IV SOLN
INTRAVENOUS | Status: DC | PRN
  Administered 2021-12-17: 60 mg via INTRAVENOUS
  Administered 2021-12-17: 20 mg via INTRAVENOUS

## 2021-12-17 MED ORDER — BUPIVACAINE LIPOSOME 1.3 % IJ SUSP: 20.0000 mL | Freq: Once | INTRAMUSCULAR | Status: DC

## 2021-12-17 MED ORDER — FENTANYL CITRATE (PF) 100 MCG/2ML IJ SOLN
INTRAMUSCULAR | Status: AC
  Administered 2021-12-17: 25 ug via INTRAVENOUS
  Filled 2021-12-17: qty 2

## 2021-12-17 MED ORDER — OXYCODONE HCL 5 MG/5ML PO SOLN: 5.0000 mg | Freq: Once | ORAL | Status: DC | PRN

## 2021-12-17 SURGICAL SUPPLY — 44 items
CANNULA CAP OBTURATR AIRSEAL 8 (CAP) ×2 IMPLANT
CLIP LIGATING HEM O LOK PURPLE (MISCELLANEOUS) ×2 IMPLANT
COVER TIP SHEARS 8 DVNC (MISCELLANEOUS) ×1 IMPLANT
COVER TIP SHEARS 8MM DA VINCI (MISCELLANEOUS) ×2
DERMABOND ADVANCED (GAUZE/BANDAGES/DRESSINGS) ×1
DERMABOND ADVANCED .7 DNX12 (GAUZE/BANDAGES/DRESSINGS) ×1 IMPLANT
DRAPE ARM DVNC X/XI (DISPOSABLE) ×4 IMPLANT
DRAPE COLUMN DVNC XI (DISPOSABLE) ×1 IMPLANT
DRAPE DA VINCI XI ARM (DISPOSABLE) ×8
DRAPE DA VINCI XI COLUMN (DISPOSABLE) ×2
ELECT CAUTERY BLADE 6.4 (BLADE) ×2 IMPLANT
GLOVE SURG ORTHO LTX SZ7.5 (GLOVE) ×8 IMPLANT
GOWN STRL REUS W/ TWL LRG LVL3 (GOWN DISPOSABLE) ×4 IMPLANT
GOWN STRL REUS W/TWL LRG LVL3 (GOWN DISPOSABLE) ×8
GRASPER SUT TROCAR 14GX15 (MISCELLANEOUS) IMPLANT
INFUSOR MANOMETER BAG 3000ML (MISCELLANEOUS) IMPLANT
IRRIGATION STRYKERFLOW (MISCELLANEOUS) IMPLANT
IRRIGATOR STRYKERFLOW (MISCELLANEOUS)
IRRIGATOR SUCT 8 DISP DVNC XI (IRRIGATION / IRRIGATOR) IMPLANT
IRRIGATOR SUCTION 8MM XI DISP (IRRIGATION / IRRIGATOR)
IV NS IRRIG 3000ML ARTHROMATIC (IV SOLUTION) IMPLANT
KIT PINK PAD W/HEAD ARE REST (MISCELLANEOUS) ×2
KIT PINK PAD W/HEAD ARM REST (MISCELLANEOUS) ×1 IMPLANT
KIT TURNOVER KIT A (KITS) ×1 IMPLANT
LABEL OR SOLS (LABEL) ×2 IMPLANT
MANIFOLD NEPTUNE II (INSTRUMENTS) ×2 IMPLANT
NDL INSUFFLATION 14GA 120MM (NEEDLE) IMPLANT
NEEDLE HYPO 22GX1.5 SAFETY (NEEDLE) ×2 IMPLANT
NEEDLE INSUFFLATION 14GA 120MM (NEEDLE) ×2 IMPLANT
NS IRRIG 500ML POUR BTL (IV SOLUTION) ×2 IMPLANT
PACK LAP CHOLECYSTECTOMY (MISCELLANEOUS) ×2 IMPLANT
PENCIL ELECTRO HAND CTR (MISCELLANEOUS) ×2 IMPLANT
POUCH SPECIMEN RETRIEVAL 10MM (ENDOMECHANICALS) ×2 IMPLANT
SEAL CANN UNIV 5-8 DVNC XI (MISCELLANEOUS) ×3 IMPLANT
SEAL XI 5MM-8MM UNIVERSAL (MISCELLANEOUS) ×6
SET TUBE FILTERED XL AIRSEAL (SET/KITS/TRAYS/PACK) ×2 IMPLANT
SOLUTION ELECTROLUBE (MISCELLANEOUS) ×2 IMPLANT
SPIKE FLUID TRANSFER (MISCELLANEOUS) ×2 IMPLANT
SUT MNCRL 4-0 (SUTURE) ×2
SUT MNCRL 4-0 27XMFL (SUTURE) ×1
SUT VICRYL 0 AB UR-6 (SUTURE) ×2 IMPLANT
SUTURE MNCRL 4-0 27XMF (SUTURE) ×1 IMPLANT
TROCAR Z-THREAD FIOS 11X100 BL (TROCAR) ×1 IMPLANT
WATER STERILE IRR 500ML POUR (IV SOLUTION) ×1 IMPLANT

## 2021-12-17 NOTE — Anesthesia Postprocedure Evaluation (Signed)
Anesthesia Post Note  Patient: Keiden Deskin  Procedure(s) Performed: XI ROBOTIC ASSISTED LAPAROSCOPIC CHOLECYSTECTOMY INDOCYANINE GREEN FLUORESCENCE IMAGING (ICG)  Patient location during evaluation: PACU Anesthesia Type: General Level of consciousness: awake and alert Pain management: pain level controlled Vital Signs Assessment: post-procedure vital signs reviewed and stable Respiratory status: spontaneous breathing, nonlabored ventilation, respiratory function stable and patient connected to nasal cannula oxygen Cardiovascular status: blood pressure returned to baseline and stable Postop Assessment: no apparent nausea or vomiting Anesthetic complications: no   No notable events documented.   Last Vitals:  Vitals:   12/17/21 1100 12/17/21 1115  BP: 132/73 124/77  Pulse: 65 64  Resp: 18 12  Temp:    SpO2: 95% 93%    Last Pain:  Vitals:   12/17/21 1115  TempSrc:   PainSc: 5                  Arita Miss

## 2021-12-17 NOTE — Op Note (Signed)
Robotic cholecystectomy with Indocyamine Green Ductal Imaging.   Pre-operative Diagnosis: Chronic calculus cholecystitis  Post-operative Diagnosis:  Same.  Procedure: Robotic assisted laparoscopic cholecystectomy with Indocyamine Green Ductal Imaging.   Surgeon: Ronny Bacon, M.D., FACS  Anesthesia: General. with endotracheal tube  Findings: Some scarring of adjacent tissues to gallbladder, gallbladder was slightly twisted and distortion, adhesions were easily lysed.  Estimated Blood Loss: 15 mL         Drains: None         Specimens: Gallbladder           Complications: none  Procedure Details  The patient was seen again in the Holding Room.  2.5 mg dose of ICG was administered intravenously.   The benefits, complications, treatment options, risks and expected outcomes were again reviewed with the patient. The likelihood of improving the patient's symptoms with return to their baseline status is good.  The patient and/or family concurred with the proposed plan, giving informed consent, again alternatives reviewed.  The patient was taken to Operating Room, identified, and the procedure verified as robotic assisted laparoscopic cholecystectomy.  Prior to the induction of general anesthesia, antibiotic prophylaxis was administered. VTE prophylaxis was in place. General endotracheal anesthesia was then administered and tolerated well. The patient was positioned in the supine position.  After the induction, the abdomen was prepped with Chloraprep and draped in the sterile fashion.  A Time Out was held and the above information confirmed.  After local infiltration of quarter percent Marcaine with epinephrine, stab incision was made left upper quadrant.  Just below the costal margin at Palmer's point, approximately midclavicular line the Veres needle is passed with sensation of the layers to penetrate the abdominal wall and into the peritoneum.  Saline drop test is confirmed peritoneal  placement.  Insufflation is initiated with carbon dioxide to pressures of 15 mmHg.  Right infra-umbilical local infiltration with quarter percent Marcaine with epinephrine is utilized.  Made a 12 mm incision on the right periumbilical site, I advanced an optical 11mm port under direct visualization into the peritoneal cavity.  Once the peritoneum was penetrated, insufflation was initiated.  The trocar was then advanced into the abdominal cavity under direct visualization. Pneumoperitoneum was then continued with Air seal utilizing CO2 at 15 mmHg or less and tolerated well without any adverse changes in the patient's vital signs.  Two 8.5-mm ports were placed in the left lower quadrant and laterally, and one to the right lower quadrant, all under direct vision. All skin incisions  were infiltrated with a local anesthetic agent before making the incision and placing the trocars.  The patient was positioned  in reverse Trendelenburg, tilted the patient's left side down.  Da Vinci XI robot was then positioned on to the patient's left side, and docked.  The gallbladder was identified, the fundus grasped via the arm 4 Prograsp and retracted cephalad. Adhesions were lysed with scissors and cautery.  The infundibulum was identified grasped and retracted laterally, exposing the peritoneum overlying the triangle of Calot. This was then opened and dissected using cautery & scissors. An extended critical view of the cystic duct and cystic artery was obtained, aided by the ICG via FireFly which enabled ready visualization of the ductal anatomy.    The cystic duct was clearly identified and dissected to isolation.   Artery well isolated and clipped, and the cystic duct was triple clipped and divided with scissors, as close to the gallbladder neck as feasible, thus leaving two on the remaining stump.  The specimen side of the artery is sealed with bipolar and divided with monopolar scissors.   The gallbladder was taken  from the gallbladder fossa in a retrograde fashion with the electrocautery. The gallbladder was removed and placed in an Endocatch bag.  The liver bed is inspected. Hemostasis was confirmed.  The robot was undocked and moved away from the operative field. No irrigation was utilized.  The gallbladder and Endocatch sac were then removed through the infraumbilical port site.   Inspection of the right upper quadrant was performed. No bleeding, bile duct injury or leak, or bowel injury was noted. The infra-umbilical port site fascia was closed with interrumpted 0 Vicryl sutures using PMI/cone under direct visualization. Pneumoperitoneum was released and ports removed.  4-0 subcuticular Monocryl was used to close the skin. Dermabond was  applied.  The patient was then extubated and brought to the recovery room in stable condition. Sponge, lap, and needle counts were correct at closure and at the conclusion of the case.               Ronny Bacon, M.D., [facility] 12/17/2021 10:32 AM

## 2021-12-17 NOTE — Interval H&P Note (Signed)
History and Physical Interval Note:  12/17/2021 8:51 AM  Timothy Atkins  has presented today for surgery, with the diagnosis of chronic calculous cholecystitis.  The various methods of treatment have been discussed with the patient and family. After consideration of risks, benefits and other options for treatment, the patient has consented to  Procedure(s): XI ROBOTIC ASSISTED LAPAROSCOPIC CHOLECYSTECTOMY (N/A) [place] (ICG) (N/A) as a surgical intervention.  The patient's history has been reviewed, patient examined, no change in status, stable for surgery.  I have reviewed the patient's chart and labs.  Questions were answered to the patient's satisfaction.     Ronny Bacon

## 2021-12-17 NOTE — Discharge Instructions (Addendum)
AMBULATORY SURGERY  DISCHARGE INSTRUCTIONS   The drugs that you were given will stay in your system until tomorrow so for the next 24 hours you should not:  Drive an automobile Make any legal decisions Drink any alcoholic beverage   You may resume regular meals tomorrow.  Today it is better to start with liquids and gradually work up to solid foods.  You may eat anything you prefer, but it is better to start with liquids, then soup and crackers, and gradually work up to solid foods.   Please notify your doctor immediately if you have any unusual bleeding, trouble breathing, redness and pain at the surgery site, drainage, fever, or pain not relieved by medication.       Please contact your physician with any problems or Same Day Surgery at [phone], Monday through Friday 6 am to 4 pm, or [facility] Main number at [phone].  

## 2021-12-17 NOTE — Anesthesia Procedure Notes (Signed)
Procedure Name: Intubation Date/Time: 12/17/2021 9:44 AM Performed by: Arita Miss, MD Pre-anesthesia Checklist: Patient identified, Patient being monitored, Timeout performed, Emergency Drugs available and Suction available Patient Re-evaluated:Patient Re-evaluated prior to induction Oxygen Delivery Method: Circle system utilized Preoxygenation: Pre-oxygenation with 100% oxygen Induction Type: IV induction Ventilation: Mask ventilation without difficulty and Oral airway inserted - appropriate to patient size Laryngoscope Size: 3 and McGraph Grade View: Grade I Tube type: Oral Tube size: 7.0 mm Number of attempts: 1 Placement Confirmation: ETT inserted through vocal cords under direct vision, positive ETCO2 and breath sounds checked- equal and bilateral Secured at: 23 cm Tube secured with: Tape Dental Injury: Teeth and Oropharynx as per pre-operative assessment

## 2021-12-17 NOTE — Transfer of Care (Signed)
Immediate Anesthesia Transfer of Care Note  Patient: Timothy Atkins  Procedure(s) Performed: XI ROBOTIC ASSISTED LAPAROSCOPIC CHOLECYSTECTOMY INDOCYANINE GREEN FLUORESCENCE IMAGING (ICG)  Patient Location: PACU  Anesthesia Type:General  Level of Consciousness: awake, alert  and drowsy  Airway & Oxygen Therapy: Patient Spontanous Breathing and Patient connected to nasal cannula oxygen  Post-op Assessment: Report given to RN and Post -op Vital signs reviewed and stable  Post vital signs: Reviewed and stable  Last Vitals:  Vitals Value Taken Time  BP 142/82 12/17/21 1036  Temp 36.3 C 12/17/21 1036  Pulse 68 12/17/21 1038  Resp 24 12/17/21 1038  SpO2 96 % 12/17/21 1038  Vitals shown include unvalidated device data.  Last Pain:  Vitals:   12/17/21 1036  TempSrc:   PainSc: 0-No pain         Complications: No notable events documented.

## 2021-12-17 NOTE — Anesthesia Preprocedure Evaluation (Signed)
Anesthesia Evaluation  Patient identified by MRN, date of birth, ID band Patient awake    Reviewed: Allergy & Precautions, NPO status , Patient's Chart, lab work & pertinent test results  History of Anesthesia Complications Negative for: history of anesthetic complications  Airway Mallampati: III  TM Distance: >3 FB Neck ROM: full    Dental  (+) Chipped   Pulmonary neg pulmonary ROS, neg shortness of breath,    Pulmonary exam normal        Cardiovascular Exercise Tolerance: Good (-) angina(-) Past MI and (-) PND negative cardio ROS Normal cardiovascular exam     Neuro/Psych negative neurological ROS  negative psych ROS   GI/Hepatic Neg liver ROS, GERD  Controlled,  Endo/Other  diabetes, Type 2, Oral Hypoglycemic Agents  Renal/GU Renal disease     Musculoskeletal  (+) Arthritis ,   Abdominal   Peds  Hematology negative hematology ROS (+)   Anesthesia Other Findings Past Medical History: No date: Arthritis No date: Benign cyst of right kidney No date: Constipation No date: Degenerative cervical disc     Comment:  mild  No date: Fatty liver No date: Fatty liver 06/08/2013: Glucose intolerance (impaired glucose tolerance) No date: Hyperlipidemia 08/19/2011: Hyperprolactinemia ([place])     Comment:  Overview:  05/2004 1363 ng/ml 06/13/2013: Hypogonadotropic hypogonadism syndrome, 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[place]) 08/19/2011: 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 3 severe episodes over 3              weeks  Past Surgical History: as child: APPENDECTOMY as child: TONSILLECTOMY  BMI    Body Mass Index: 36.30 kg/m      Reproductive/Obstetrics negative OB ROS                             Anesthesia Physical Anesthesia Plan  ASA: 3  Anesthesia Plan: General ETT   Post-op Pain Management:    Induction: Intravenous  PONV Risk Score and Plan: Ondansetron, Dexamethasone, Midazolam and Treatment may vary due to age or medical condition  Airway Management Planned: Oral ETT  Additional Equipment:   Intra-op Plan:   Post-operative Plan: Extubation in OR  Informed Consent: I have reviewed the patients History and Physical, chart, labs and discussed the procedure including the risks, benefits and alternatives for the proposed anesthesia with the patient or authorized representative who has indicated his/her understanding and acceptance.     Dental Advisory Given  Plan Discussed with: Anesthesiologist, CRNA and Surgeon  Anesthesia Plan Comments: (Patient consented for risks of anesthesia including but not limited to:  - adverse reactions to medications - damage to eyes, teeth, lips or other oral mucosa - nerve damage due to positioning  - sore throat or hoarseness - Damage to heart,  brain, nerves, lungs, other parts of body or loss of life  Patient voiced understanding.)        Anesthesia Quick Evaluation

## 2021-12-17 NOTE — Addendum Note (Signed)
Addendum  created 12/17/21 1231 by Arita Miss, MD   Order list changed, Pharmacy for encounter modified

## 2021-12-17 NOTE — Addendum Note (Signed)
Addendum  created 12/17/21 1324 by Arita Miss, MD   Charge Capture section accepted

## 2021-12-18 LAB — SURGICAL PATHOLOGY

## 2021-12-25 ENCOUNTER — Telehealth: Payer: Self-pay | Admitting: *Deleted

## 2021-12-25 NOTE — Telephone Encounter (Signed)
Spoke with the patient and his wife will come by tomorrow to pick up this note.  ?

## 2021-12-25 NOTE — Telephone Encounter (Signed)
Patient called and wanted to come pick up a note stating that he can work from home from 12/24/21-01/01/22 due to what restrictions was placed on him. He had surgery on 12/17/21 Dr Christian Mate gallbladder removed. Please call and advise  ?

## 2022-01-01 ENCOUNTER — Ambulatory Visit (INDEPENDENT_AMBULATORY_CARE_PROVIDER_SITE_OTHER): Payer: 59 | Admitting: Surgery

## 2022-01-01 ENCOUNTER — Other Ambulatory Visit: Payer: Self-pay

## 2022-01-01 ENCOUNTER — Encounter: Payer: Self-pay | Admitting: Surgery

## 2022-01-01 VITALS — BP 120/80 | HR 79 | Temp 98.8°F | Ht 70.0 in | Wt 243.2 lb

## 2022-01-01 DIAGNOSIS — Z9049 Acquired absence of other specified parts of digestive tract: Secondary | ICD-10-CM

## 2022-01-01 DIAGNOSIS — Z09 Encounter for follow-up examination after completed treatment for conditions other than malignant neoplasm: Secondary | ICD-10-CM

## 2022-01-01 DIAGNOSIS — K8010 Calculus of gallbladder with chronic cholecystitis without obstruction: Secondary | ICD-10-CM

## 2022-01-01 NOTE — Progress Notes (Signed)
[facility] ?POST-OP OFFICE VISIT ? ?01/01/2022 ? ?HPI: ?Timothy Atkins is a 56 y.o. male 15 days s/p robotic cholecystectomy.  He reports tolerating a regular diet, had 3 loose bowel movements yesterday, but otherwise doing well from a GI perspective.  Denies nausea and vomiting, denies fevers chills.  Has some lingering residual right upper quadrant discomfort which still seems to radiate toward the back.  Sclera are clear.  Pathology reviewed and benign. ? ?Vital signs: ?BP 120/80   Pulse 79   Temp 98.8 ?F (37.1 ?C) (Oral)   Ht '5\' 10"'$  (1.778 m)   Wt 243 lb 3.2 oz (110.3 kg)   SpO2 97%   BMI 34.90 kg/m?   ? ?Physical Exam: ?Constitutional: Appears well ?Abdomen: Soft nontender. ?Skin: Incisions are clean, dry and intact. ? ?Assessment/Plan: ?This is a 56 y.o. male 15 days s/p robotic cholecystectomy ? ?Patient Active Problem List  ? Diagnosis Date Noted  ? CCC (chronic calculous cholecystitis) 12/02/2021  ? Obesity 08/02/2021  ? Type 2 or unspecified type diabetes mellitus, uncontrolled 08/02/2021  ? Vitamin D deficiency 08/02/2021  ? Esophageal reflux 03/17/2019  ? Hyperlipidemia 03/17/2019  ? Pain in joint, lower leg 03/17/2019  ? Sinusitis 07/11/2018  ? Prediabetes 02/16/2018  ? Arthritis 01/31/2018  ? Fatty liver 01/31/2018  ? Allergic rhinitis 01/31/2018  ? Pituitary macroadenoma ([place]) 01/31/2018  ? Pain of upper abdomen   ? 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 Neck nodule 06/12/2013  ? Glucose intolerance (impaired glucose tolerance) 06/08/2013  ? Male hypogonadism 06/08/2013  ? Hyperprolactinemia ([place]) 08/19/2011  ? 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 ? -As he does no heavy lifting for his work, he will he may resume as soon as he desires.  We will be glad to see him back as needed. ? ? ?Ronny Bacon M.D., FACS ?01/01/2022, 9:18 AM  ?

## 2022-01-01 NOTE — Patient Instructions (Signed)
GENERAL POST-OPERATIVE ?PATIENT INSTRUCTIONS  ? ?WOUND CARE INSTRUCTIONS:  Keep a dry clean dressing on the wound if there is drainage. The initial bandage may be removed after 24 hours.  Once the wound has quit draining you may leave it open to air.  If clothing rubs against the wound or causes irritation and the wound is not draining you may cover it with a dry dressing during the daytime.  Try to keep the wound dry and avoid ointments on the wound unless directed to do so.  If the wound becomes bright red and painful or starts to drain infected material that is not clear, please contact your physician immediately.  If the wound is mildly pink and has a thick firm ridge underneath it, this is normal, and is referred to as a healing ridge.  This will resolve over the next 4-6 weeks. ? ?BATHING: ?You may shower if you have been informed of this by your surgeon. However, Please do not submerge in a tub, hot tub, or pool until incisions are completely sealed or have been told by your surgeon that you may do so. ? ?DIET:  You may eat any foods that you can tolerate.  It is a good idea to eat a high fiber diet and take in plenty of fluids to prevent constipation.  If you do become constipated you may want to take a mild laxative or take ducolax tablets on a daily basis until your bowel habits are regular.  Constipation can be very uncomfortable, along with straining, after recent surgery. ? ?ACTIVITY:  You are encouraged to cough and deep breath or use your incentive spirometer if you were given one, every 15-30 minutes when awake.  This will help prevent respiratory complications and low grade fevers post-operatively if you had a general anesthetic.  You may want to hug a pillow when coughing and sneezing to add additional support to the surgical area, if you had abdominal or chest surgery, which will decrease pain during these times.  You are encouraged to walk and engage in light activity for the next two weeks.  You  should not lift more than 20 pounds, until 01/28/2022 as it could put you at increased risk for complications.  Twenty pounds is roughly equivalent to a plastic bag of groceries. At that time- Listen to your body when lifting, if you have pain when lifting, stop and then try again in a few days. Soreness after doing exercises or activities of daily living is normal as you get back in to your normal routine. ? ?MEDICATIONS:  Try to take narcotic medications and anti-inflammatory medications, such as tylenol, ibuprofen, naprosyn, etc., with food.  This will minimize stomach upset from the medication.  Should you develop nausea and vomiting from the pain medication, or develop a rash, please discontinue the medication and contact your physician.  You should not drive, make important decisions, or operate machinery when taking narcotic pain medication. ? ?SUNBLOCK ?Use sun block to incision area over the next year if this area will be exposed to sun. This helps decrease scarring and will allow you avoid a permanent darkened area over your incision. ? ?QUESTIONS:  Please feel free to call our office if you have any questions, and we will be glad to assist you. [phone] ? ? ?

## 2022-06-19 ENCOUNTER — Other Ambulatory Visit: Payer: Self-pay | Admitting: Internal Medicine

## 2022-06-19 DIAGNOSIS — J329 Chronic sinusitis, unspecified: Secondary | ICD-10-CM

## 2022-12-04 ENCOUNTER — Ambulatory Visit: Payer: 59 | Admitting: Internal Medicine

## 2022-12-18 ENCOUNTER — Ambulatory Visit: Payer: 59 | Admitting: Internal Medicine

## 2022-12-22 ENCOUNTER — Encounter: Payer: Self-pay | Admitting: Internal Medicine

## 2022-12-22 ENCOUNTER — Ambulatory Visit (INDEPENDENT_AMBULATORY_CARE_PROVIDER_SITE_OTHER): Payer: 59 | Admitting: Internal Medicine

## 2022-12-22 VITALS — BP 140/88 | HR 76 | Ht 70.0 in | Wt 251.0 lb

## 2022-12-22 DIAGNOSIS — I10 Essential (primary) hypertension: Secondary | ICD-10-CM | POA: Diagnosis not present

## 2022-12-22 DIAGNOSIS — Z6837 Body mass index (BMI) 37.0-37.9, adult: Secondary | ICD-10-CM

## 2022-12-22 DIAGNOSIS — E1165 Type 2 diabetes mellitus with hyperglycemia: Secondary | ICD-10-CM | POA: Diagnosis not present

## 2022-12-22 DIAGNOSIS — R7303 Prediabetes: Secondary | ICD-10-CM | POA: Diagnosis not present

## 2022-12-22 LAB — POCT CBG (FASTING - GLUCOSE)-MANUAL ENTRY: Glucose Fasting, POC: 119 mg/dL — AB (ref 70–99)

## 2022-12-22 MED ORDER — LISINOPRIL 10 MG PO TABS: 10.0000 mg | ORAL_TABLET | Freq: Every day | ORAL | 3 refills | Status: DC

## 2022-12-22 MED ORDER — RYBELSUS 14 MG PO TABS: 14.0000 mg | ORAL_TABLET | Freq: Every day | ORAL | 8 refills | Status: DC

## 2022-12-22 MED ORDER — METFORMIN HCL 1000 MG PO TABS: 1000.0000 mg | ORAL_TABLET | Freq: Two times a day (BID) | ORAL | 3 refills | Status: DC

## 2022-12-22 NOTE — Progress Notes (Signed)
Established Patient Office Visit  Subjective:  Patient ID: Timothy Atkins, male    DOB: 14-Sep-1966  Age: 57 y.o. MRN: [id number]  Chief Complaint  Patient presents with   Follow-up    1 month follow up    Patient comes in for his follow-up today.  He is currently taking Rybelsus 7 mg and tolerating it well.  He did not complain of any nausea.  He also has lost some weight on it.  Will increase Rybelsus to 14 mg/day.  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 3 severe episodes over 3 weeks   Past Surgical History:  Procedure Laterality Date   APPENDECTOMY  as child   TONSILLECTOMY  as child    Social History   Socioeconomic History   Marital status: Married    Spouse name: Not on file   Number of children: Not on file   Years of education: Not on file   Highest education level: Not on file  Occupational History   Not on file  Tobacco Use   Smoking status: Never   Smokeless tobacco: Never  Vaping Use    Vaping Use: Never used  Substance and Sexual Activity   Alcohol use: No   Drug use: No   Sexual activity: Yes  Other Topics Concern   Not on file  Social History Narrative   Married    1 daughter    Bachelors    Works in Engineer, technical sales   No guns    Wears seat belt, safe in relationship    Social Determinants of Radio broadcast assistant Strain: Not on file  Food Insecurity: Not on file  Transportation Needs: Not on file  Physical Activity: Not on file  Stress: Not on file  Social Connections: Not on file  Intimate Partner Violence: Not on file    Family History  Problem Relation Age of Onset   Healthy Mother    Diabetes Mother    Heart disease Mother    Hyperlipidemia Mother    Hypertension Mother    Healthy Father    Asthma Father    COPD Father    Diabetes Father    Heart disease Father    Depression Brother    Diabetes Brother    Hyperlipidemia Brother    Hypertension Brother     No Known Allergies  Review of Systems  Constitutional: Negative.   HENT: Negative.    Eyes: Negative.   Respiratory: Negative.    Gastrointestinal: Negative.   Genitourinary: Negative.   Musculoskeletal: Negative.   Skin: Negative.   Neurological: Negative.   Endo/Heme/Allergies: Negative.   Psychiatric/Behavioral: Negative.         Objective:   BP (!) 140/88   Pulse 76   Ht '5\' 10"'$  (1.778 m)   Wt 251 lb (113.9 kg)   SpO2 98%   BMI 36.01 kg/m   Vitals:   12/22/22 1123  BP: (!) 140/88  Pulse: 76  Height: '5\' 10"'$  (1.778 m)  Weight: 251 lb (113.9 kg)  SpO2: 98%  BMI (Calculated): 36.01    Physical Exam Vitals and nursing note reviewed.  Constitutional:      Appearance: Normal appearance. He is obese.  Cardiovascular:     Rate and Rhythm: Normal rate and regular rhythm.  Pulmonary:     Effort: Pulmonary effort is normal.     Breath sounds: Normal breath sounds.  Abdominal:     General: Abdomen is flat. Bowel sounds are normal.     Palpations: Abdomen is soft.   Musculoskeletal:        General: Normal range of motion.  Neurological:     General: No focal deficit present.     Mental Status: He is alert and oriented to person, place, and time.  Psychiatric:        Mood and Affect: Mood normal.        Behavior: Behavior normal.      Results for orders placed or performed in visit on 12/22/22  POCT CBG (Fasting - Glucose)  Result Value Ref Range   Glucose Fasting, POC 119 (A) 70 - 99 mg/dL    Recent Results (from the past 2160 hour(s))  POCT CBG (Fasting - Glucose)     Status: Abnormal   Collection Time: 12/22/22 11:25 AM  Result Value Ref Range   Glucose Fasting, POC 119 (A) 70 - 99 mg/dL      Assessment & Plan:  New prescriptions sent for Rybelsus 14 mg/day. Also to increase the dose of lisinopril to 10 mg/day. Monitor blood pressure and blood sugar at home. Problem List Items Addressed This Visit     Prediabetes   Relevant Orders   POCT CBG (Fasting - Glucose) (Completed)   Obesity   Relevant Medications   Semaglutide (RYBELSUS) 14 MG TABS   metFORMIN (GLUCOPHAGE) 1000 MG tablet   Type 2 diabetes mellitus with hyperglycemia, without long-term current use of insulin (HCC) - Primary   Relevant Medications   Semaglutide (RYBELSUS) 14 MG TABS   metFORMIN (GLUCOPHAGE) 1000 MG tablet   lisinopril (ZESTRIL) 10 MG tablet   Essential hypertension, benign   Relevant Medications   lisinopril (ZESTRIL) 10 MG tablet    Return in about 6 weeks (around 02/02/2023).   Total time spent: 20 minutes  Perrin Maltese, MD  12/22/2022

## 2022-12-28 ENCOUNTER — Emergency Department
Admission: EM | Admit: 2022-12-28 | Discharge: 2022-12-28 | Disposition: A | Discharge status: Home / Self Care | Payer: Managed Care, Other (non HMO) | Attending: Emergency Medicine | Admitting: Emergency Medicine

## 2022-12-28 ENCOUNTER — Emergency Department: Payer: Managed Care, Other (non HMO)

## 2022-12-28 ENCOUNTER — Other Ambulatory Visit: Payer: Self-pay

## 2022-12-28 ENCOUNTER — Encounter: Payer: Self-pay | Admitting: Emergency Medicine

## 2022-12-28 DIAGNOSIS — R197 Diarrhea, unspecified: Secondary | ICD-10-CM | POA: Insufficient documentation

## 2022-12-28 DIAGNOSIS — I10 Essential (primary) hypertension: Secondary | ICD-10-CM | POA: Insufficient documentation

## 2022-12-28 DIAGNOSIS — E1165 Type 2 diabetes mellitus with hyperglycemia: Secondary | ICD-10-CM | POA: Diagnosis not present

## 2022-12-28 DIAGNOSIS — R112 Nausea with vomiting, unspecified: Secondary | ICD-10-CM | POA: Diagnosis present

## 2022-12-28 DIAGNOSIS — Z794 Long term (current) use of insulin: Secondary | ICD-10-CM | POA: Diagnosis not present

## 2022-12-28 DIAGNOSIS — Z1152 Encounter for screening for COVID-19: Secondary | ICD-10-CM | POA: Diagnosis not present

## 2022-12-28 DIAGNOSIS — R1084 Generalized abdominal pain: Secondary | ICD-10-CM | POA: Insufficient documentation

## 2022-12-28 DIAGNOSIS — H9201 Otalgia, right ear: Secondary | ICD-10-CM | POA: Diagnosis not present

## 2022-12-28 LAB — CBC
HCT: 40.9 % (ref 39.0–52.0)
Hemoglobin: 13.8 g/dL (ref 13.0–17.0)
MCH: 29.4 pg (ref 26.0–34.0)
MCHC: 33.7 g/dL (ref 30.0–36.0)
MCV: 87.2 fL (ref 80.0–100.0)
Platelets: 252 10*3/uL (ref 150–400)
RBC: 4.69 MIL/uL (ref 4.22–5.81)
RDW: 12.8 % (ref 11.5–15.5)
WBC: 9 10*3/uL (ref 4.0–10.5)
nRBC: 0 % (ref 0.0–0.2)

## 2022-12-28 LAB — COMPREHENSIVE METABOLIC PANEL
ALT: 48 U/L — ABNORMAL HIGH (ref 0–44)
AST: 31 U/L (ref 15–41)
Albumin: 4.3 g/dL (ref 3.5–5.0)
Alkaline Phosphatase: 66 U/L (ref 38–126)
Anion gap: 10 (ref 5–15)
BUN: 18 mg/dL (ref 6–20)
CO2: 24 mmol/L (ref 22–32)
Calcium: 9.2 mg/dL (ref 8.9–10.3)
Chloride: 102 mmol/L (ref 98–111)
Creatinine, Ser: 0.99 mg/dL (ref 0.61–1.24)
GFR, Estimated: >60 mL/min (ref >60)
Glucose, Bld: 111 mg/dL — ABNORMAL HIGH (ref 70–99)
Potassium: 4 mmol/L (ref 3.5–5.1)
Sodium: 136 mmol/L (ref 135–145)
Total Bilirubin: 0.8 mg/dL (ref 0.3–1.2)
Total Protein: 7.6 g/dL (ref 6.5–8.1)

## 2022-12-28 LAB — URINALYSIS, ROUTINE W REFLEX MICROSCOPIC
Bilirubin Urine: NEGATIVE
Glucose, UA: NEGATIVE mg/dL
Hgb urine dipstick: NEGATIVE
Ketones, ur: NEGATIVE mg/dL
Leukocytes,Ua: NEGATIVE
Nitrite: NEGATIVE
Protein, ur: NEGATIVE mg/dL
Specific Gravity, Urine: 1.025 (ref 1.005–1.030)
pH: 5 (ref 5.0–8.0)

## 2022-12-28 LAB — RESP PANEL BY RT-PCR (RSV, FLU A&B, COVID)  RVPGX2
Influenza A by PCR: NEGATIVE
Influenza B by PCR: NEGATIVE
Resp Syncytial Virus by PCR: NEGATIVE
SARS Coronavirus 2 by RT PCR: NEGATIVE

## 2022-12-28 LAB — TSH: TSH: 1.749 u[IU]/mL (ref 0.350–4.500)

## 2022-12-28 LAB — LIPASE, BLOOD: Lipase: 34 U/L (ref 11–51)

## 2022-12-28 MED ORDER — IOHEXOL 300 MG/ML  SOLN
100.0000 mL | Freq: Once | INTRAMUSCULAR | Status: AC | PRN
  Administered 2022-12-28: 100 mL via INTRAVENOUS

## 2022-12-28 MED ORDER — SODIUM CHLORIDE 0.9 % IV BOLUS
1000.0000 mL | Freq: Once | INTRAVENOUS | Status: AC
  Administered 2022-12-28: 1000 mL via INTRAVENOUS

## 2022-12-28 NOTE — ED Notes (Signed)
See triage note  Presents with intermittent "sickness " for about 2 weeks  States positive nausea   Vomited time 1   Subjective fever over the past 2 weeks  Afebrile at present

## 2022-12-28 NOTE — ED Triage Notes (Addendum)
Pt states coming in with abdominal pain for 2 weeks. Pt states fever and chills and not able to keep anything down. Pt states he was in the hospital with a family member who was having a kidney transplant. Pt states taking zofran for the nausea and vomiting.  Pt also states right ear pain and numbness to the right right ear for several days

## 2022-12-28 NOTE — Discharge Instructions (Signed)
Please follow-up with your outpatient provider.  Please return for any new, worsening, or change in symptoms or other concerns.  It was a pleasure caring for you today.

## 2022-12-28 NOTE — ED Provider Notes (Signed)
[facility] Provider Note    Event Date/Time   First MD Initiated Contact with Patient 12/28/22 [phone]     (approximate)   History   Abdominal Pain   HPI  Timothy Atkins is a 57 y.o. male who presents today for evaluation of feeling of "abdominal fullness" for the past 1 week.  Patient reports that his symptoms began after staying 3 days in the hospital with his nephew who received a kidney transplant.  He reports that he can only tolerate eating small bites of things because otherwise he feels really full and nauseated.  He had 1 episode of vomiting the day the symptoms began as well as loose stools.  No blood in his emesis or stool.  He reports that last week he had chills, which has resolved.  He continues to have the feeling of abdominal fullness and mild diffuse abdominal pain over the past week.  No fevers, he has been checking his temperature.  He reports that he also had pain to his right ear which has resolved.  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   Pain of upper abdomen    Hypogonadotropic hypogonadism syndrome, 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[place]) 06/13/2013   Neck nodule 06/12/2013   Glucose intolerance (impaired glucose tolerance) 06/08/2013   Male hypogonadism 06/08/2013   Hyperprolactinemia ([place]) 08/19/2011   Invasive pituitary adenoma ([place]) 08/19/2011   Partial hypopituitarism ([place]) 08/19/2011          Physical Exam   Triage  Vital Signs: ED Triage Vitals  Enc Vitals Group     BP 12/28/22 0850 (!) 152/92     Pulse Rate 12/28/22 0850 80     Resp 12/28/22 0850 18     Temp 12/28/22 0850 98.3 F (36.8 C)     Temp src --      SpO2 12/28/22 0850 95 %     Weight 12/28/22 0851 249 lb (112.9 kg)     Height 12/28/22 0851 '5\' 10"'$  (1.778 m)     Head Circumference --      Peak Flow --      Pain Score 12/28/22 0851 4     Pain Loc --      Pain Edu? --      Excl. in [place]? --     Most recent vital signs: Vitals:   12/28/22 0850  BP: (!) 152/92  Pulse: 80  Resp: 18  Temp: 98.3 F (36.8 C)  SpO2: 95%    Physical Exam Vitals and nursing note reviewed.  Constitutional:      General: Awake and alert. No acute distress.    Appearance: Normal appearance. The patient is obese.  HENT:     Head: Normocephalic and atraumatic.     Mouth: Mucous membranes are moist.  Eyes:     General: PERRL. Normal EOMs        Right eye: No discharge.        Left eye: No discharge.     Conjunctiva/sclera: Conjunctivae  normal.  Cardiovascular:     Rate and Rhythm: Normal rate and regular rhythm.     Pulses: Normal pulses.  Pulmonary:     Effort: Pulmonary effort is normal. No respiratory distress.     Breath sounds: Normal breath sounds.  Abdominal:     Abdomen is soft. There is no abdominal tenderness. No rebound or guarding. No distention. Musculoskeletal:        General: No swelling. Normal range of motion.     Cervical back: Normal range of motion and neck supple.  Skin:    General: Skin is warm and dry.     Capillary Refill: Capillary refill takes less than 2 seconds.     Findings: No rash.  Neurological:     Mental Status: The patient is awake and alert.      ED Results / Procedures / Treatments   Labs (all labs ordered are listed, but only abnormal results are displayed) Labs Reviewed  COMPREHENSIVE METABOLIC PANEL - Abnormal; Notable for the following components:      Result Value   Glucose, Bld 111 (*)     ALT 48 (*)    All other components within normal limits  URINALYSIS, ROUTINE W REFLEX MICROSCOPIC - Abnormal; Notable for the following components:   Color, Urine STRAW (*)    APPearance CLEAR (*)    All other components within normal limits  RESP PANEL BY RT-PCR (RSV, FLU A&B, COVID)  RVPGX2  LIPASE, BLOOD  CBC  TSH     EKG     RADIOLOGY     PROCEDURES:  Critical Care performed:   Procedures   MEDICATIONS ORDERED IN ED: Medications  sodium chloride 0.9 % bolus 1,000 mL (0 mLs Intravenous Stopped 12/28/22 1150)  iohexol (OMNIPAQUE) 300 MG/ML solution 100 mL (100 mLs Intravenous Contrast Given 12/28/22 0938)     IMPRESSION / MDM / ASSESSMENT AND PLAN / ED COURSE  I reviewed the triage vital signs and the nursing notes.   Differential diagnosis includes, but is not limited to, viral etiology, URI, gastroenteritis, diverticulitis, electrolyte disarray, endocrine problem.  Patient is awake and alert, hemodynamically stable and afebrile.  He is nontoxic in appearance.  COVID/flu/RSV swab obtained and is negative.  Labs obtained are overall reassuring. Given his abdominal pain, CT scan was obtained and is negative for any acute findings.  Patient reports that he feels significantly improved after the normal saline.  Upon reevaluation, he reports that his belly pain has resolved.  Patient does have a long history of endocrine problems.  He has no change in mental status, he has normal vital signs, he has had multiple changes in his medications with his endocrinologist.  He does not appear to be in endocrine crisis.  Patient was evaluated multiple times at his emergency department stay, remained nontoxic in appearance, was able to tolerate p.o., and had no significant abdominal tenderness.  He is reassured by his workup today.  It is possible that he has a gastroenteritis or other viral syndrome from being in the hospital for 3 days with his nephew.  I recommended outpatient  follow-up with his outpatient provider as we instruct return precautions.  Patient understands and agrees with plan.  He was discharged in stable condition with his wife.  Patient's presentation is most consistent with acute complicated illness / injury requiring diagnostic workup.   FINAL CLINICAL IMPRESSION(S) / ED DIAGNOSES   Final diagnoses:  Generalized abdominal pain  Nausea vomiting and diarrhea  Rx / DC Orders   ED Discharge Orders     None        Note:  This document was prepared using Dragon voice recognition software and may include unintentional dictation errors.   Emeline Gins 12/28/22 1211    Lavonia Drafts, MD 12/28/22 [phone]

## 2023-01-19 ENCOUNTER — Other Ambulatory Visit: Payer: Self-pay | Admitting: Family

## 2023-02-09 ENCOUNTER — Ambulatory Visit: Payer: Managed Care, Other (non HMO) | Admitting: Internal Medicine

## 2023-02-18 ENCOUNTER — Encounter: Payer: Self-pay | Admitting: Internal Medicine

## 2023-02-18 ENCOUNTER — Ambulatory Visit (INDEPENDENT_AMBULATORY_CARE_PROVIDER_SITE_OTHER): Payer: Managed Care, Other (non HMO) | Admitting: Internal Medicine

## 2023-02-18 VITALS — BP 124/80 | HR 71 | Ht 70.0 in | Wt 244.6 lb

## 2023-02-18 DIAGNOSIS — I10 Essential (primary) hypertension: Secondary | ICD-10-CM | POA: Diagnosis not present

## 2023-02-18 DIAGNOSIS — D352 Benign neoplasm of pituitary gland: Secondary | ICD-10-CM | POA: Diagnosis not present

## 2023-02-18 DIAGNOSIS — E1165 Type 2 diabetes mellitus with hyperglycemia: Secondary | ICD-10-CM

## 2023-02-18 DIAGNOSIS — Z6837 Body mass index (BMI) 37.0-37.9, adult: Secondary | ICD-10-CM

## 2023-02-18 DIAGNOSIS — K219 Gastro-esophageal reflux disease without esophagitis: Secondary | ICD-10-CM

## 2023-02-18 DIAGNOSIS — E782 Mixed hyperlipidemia: Secondary | ICD-10-CM

## 2023-02-18 LAB — POCT CBG (FASTING - GLUCOSE)-MANUAL ENTRY: Glucose Fasting, POC: 130 mg/dL — AB (ref 70–99)

## 2023-02-18 NOTE — Progress Notes (Addendum)
Established Patient Office Visit  Subjective:  Patient ID: Timothy Atkins, male    DOB: 09-12-1966  Age: 57 y.o. MRN: [id number]  Chief Complaint  Patient presents with   Follow-up    6 week follow up    Patient comes in for his follow-up today.  He recently had to go to the emergency room with abdominal pain and severe nausea and constipation.  He reports that when he increase his Rybelsus to 14 mg/day he developed the symptoms.  He had a workup which was relatively unremarkable at the emergency room.  He has however stopped his Rybelsus completely.  Patient has lost significant weight while he was taking Rybelsus.  He is feeling better now and agrees to start back at a small dose 3 mg.  He will also return for fasting blood work.    No other concerns at this time.   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 3 severe episodes over 3 weeks    Past Surgical History:  Procedure Laterality Date   APPENDECTOMY  as child   TONSILLECTOMY  as child    Social History   Socioeconomic History   Marital status: Married    Spouse name:  Not on file   Number of children: Not on file   Years of education: Not on file   Highest education level: Not on file  Occupational History   Not on file  Tobacco Use   Smoking status: Never   Smokeless tobacco: Never  Vaping Use   Vaping Use: Never used  Substance and Sexual Activity   Alcohol use: No   Drug use: No   Sexual activity: Yes  Other Topics Concern   Not on file  Social History Narrative   Married    1 daughter    Bachelors    Works in Consulting civil engineer   No guns    Wears seat belt, safe in relationship    Social Determinants of Corporate investment banker Strain: Not on file  Food Insecurity: Not on file  Transportation Needs: Not on file  Physical Activity: Not on file  Stress: Not on file  Social Connections: Not on file  Intimate Partner Violence: Not on file    Family History  Problem Relation Age of Onset   Healthy Mother    Diabetes Mother    Heart disease Mother    Hyperlipidemia Mother    Hypertension Mother    Healthy Father    Asthma Father    COPD Father  Diabetes Father    Heart disease Father    Depression Brother    Diabetes Brother    Hyperlipidemia Brother    Hypertension Brother     Allergies  Allergen Reactions   Semaglutide Diarrhea and Nausea And Vomiting    Rybelsus, caused severe abdominal pain    Review of Systems  Constitutional:  Positive for weight loss. Negative for chills, diaphoresis, fever and malaise/fatigue.  HENT: Negative.    Eyes: Negative.   Respiratory: Negative.    Cardiovascular: Negative.   Gastrointestinal:  Negative for abdominal pain, blood in stool, constipation, diarrhea, heartburn, melena, nausea and vomiting.  Genitourinary: Negative.   Musculoskeletal: Negative.   Skin: Negative.   Neurological: Negative.   Psychiatric/Behavioral: Negative.         Objective:   BP 124/80   Pulse 71   Ht  (1.778 m)   Wt 244 lb 9.6 oz (110.9 kg)   SpO2 98%   BMI 35.10 kg/m   Vitals:   02/18/23  1108  BP: 124/80  Pulse: 71  Height:  (1.778 m)  Weight: 244 lb 9.6 oz (110.9 kg)  SpO2: 98%  BMI (Calculated): 35.1    Physical Exam Vitals and nursing note reviewed.  Constitutional:      Appearance: Normal appearance. He is obese.  Eyes:     Pupils: Pupils are equal, round, and reactive to light.  Cardiovascular:     Rate and Rhythm: Normal rate and regular rhythm.     Pulses: Normal pulses.     Heart sounds: Normal heart sounds. No murmur heard. Pulmonary:     Effort: Pulmonary effort is normal.     Breath sounds: Normal breath sounds. No wheezing or rales.  Abdominal:     General: Bowel sounds are normal.     Palpations: Abdomen is soft.  Musculoskeletal:        General: Normal range of motion.     Cervical back: Normal range of motion and neck supple.  Skin:    General: Skin is warm and dry.  Neurological:     General: No focal deficit present.     Mental Status: He is alert and oriented to person, place, and time.      Results for orders placed or performed in visit on 02/18/23  POCT CBG (Fasting - Glucose)  Result Value Ref Range   Glucose Fasting, POC 130 (A) 70 - 99 mg/dL        Assessment & Plan:  Patient advised to continue taking all his medications as such.  Samples of Rybelsus given to be resumed at 3 mg/day and see if he can tolerate them. Problem List Items Addressed This Visit     Pituitary macroadenoma (HCC)   Esophageal reflux   Hyperlipidemia   Relevant Orders   Lipid Panel w/o Chol/HDL Ratio   Obesity   Type 2 diabetes mellitus with hyperglycemia, without long-term current use of insulin - Primary   Relevant Orders   POCT CBG (Fasting - Glucose) (Completed)   CMP14+EGFR   Hemoglobin A1c   Essential hypertension, benign   Relevant Orders   CBC With Differential    Follow up 3 month.  Total time spent: 25 minutes  Margaretann Loveless, MD  02/18/2023

## 2023-02-19 ENCOUNTER — Ambulatory Visit: Payer: Managed Care, Other (non HMO) | Admitting: Internal Medicine

## 2023-02-19 ENCOUNTER — Other Ambulatory Visit: Payer: Managed Care, Other (non HMO)

## 2023-02-20 LAB — CMP14+EGFR
ALT: 34 IU/L (ref 0–44)
AST: 24 IU/L (ref 0–40)
Albumin/Globulin Ratio: 1.7 (ref 1.2–2.2)
Albumin: 4.4 g/dL (ref 3.8–4.9)
Alkaline Phosphatase: 78 IU/L (ref 44–121)
BUN/Creatinine Ratio: 20 (ref 9–20)
BUN: 20 mg/dL (ref 6–24)
Bilirubin Total: 0.5 mg/dL (ref 0.0–1.2)
CO2: 21 mmol/L (ref 20–29)
Calcium: 9.5 mg/dL (ref 8.7–10.2)
Chloride: 102 mmol/L (ref 96–106)
Creatinine, Ser: 0.98 mg/dL (ref 0.76–1.27)
Globulin, Total: 2.6 g/dL (ref 1.5–4.5)
Glucose: 110 mg/dL — ABNORMAL HIGH (ref 70–99)
Potassium: 4.4 mmol/L (ref 3.5–5.2)
Sodium: 140 mmol/L (ref 134–144)
Total Protein: 7 g/dL (ref 6.0–8.5)
eGFR: 92 mL/min/{1.73_m2} (ref >59)

## 2023-02-20 LAB — CBC WITH DIFFERENTIAL
Basophils Absolute: 0.1 10*3/uL (ref 0.0–0.2)
Basos: 1 %
EOS (ABSOLUTE): 0.3 10*3/uL (ref 0.0–0.4)
Eos: 4 %
Hematocrit: 41.5 % (ref 37.5–51.0)
Hemoglobin: 14 g/dL (ref 13.0–17.7)
Immature Grans (Abs): 0 10*3/uL (ref 0.0–0.1)
Immature Granulocytes: 0 %
Lymphocytes Absolute: 3.3 10*3/uL — ABNORMAL HIGH (ref 0.7–3.1)
Lymphs: 39 %
MCH: 30.4 pg (ref 26.6–33.0)
MCHC: 33.7 g/dL (ref 31.5–35.7)
MCV: 90 fL (ref 79–97)
Monocytes Absolute: 0.5 10*3/uL (ref 0.1–0.9)
Monocytes: 6 %
Neutrophils Absolute: 4.4 10*3/uL (ref 1.4–7.0)
Neutrophils: 50 %
RBC: 4.61 x10E6/uL (ref 4.14–5.80)
RDW: 13.1 % (ref 11.6–15.4)
WBC: 8.6 10*3/uL (ref 3.4–10.8)

## 2023-02-20 LAB — LIPID PANEL W/O CHOL/HDL RATIO
Cholesterol, Total: 158 mg/dL (ref 100–199)
HDL: 51 mg/dL (ref >39)
LDL Chol Calc (NIH): 90 mg/dL (ref 0–99)
Triglycerides: 94 mg/dL (ref 0–149)
VLDL Cholesterol Cal: 17 mg/dL (ref 5–40)

## 2023-02-20 LAB — HEMOGLOBIN A1C
Est. average glucose Bld gHb Est-mCnc: 131 mg/dL
Hgb A1c MFr Bld: 6.2 % — ABNORMAL HIGH (ref 4.8–5.6)

## 2023-02-25 ENCOUNTER — Other Ambulatory Visit: Payer: Self-pay

## 2023-02-25 MED ORDER — ROSUVASTATIN CALCIUM 10 MG PO TABS: 10.0000 mg | ORAL_TABLET | Freq: Every day | ORAL | 3 refills | Status: DC

## 2023-02-25 NOTE — Progress Notes (Signed)
Lm for pt to call back, rx sent

## 2023-02-25 NOTE — Progress Notes (Signed)
Pt informed

## 2023-05-21 ENCOUNTER — Ambulatory Visit: Payer: Managed Care, Other (non HMO) | Admitting: Internal Medicine

## 2023-06-04 ENCOUNTER — Encounter: Payer: Self-pay | Admitting: Internal Medicine

## 2023-06-04 ENCOUNTER — Ambulatory Visit (INDEPENDENT_AMBULATORY_CARE_PROVIDER_SITE_OTHER): Payer: Managed Care, Other (non HMO) | Admitting: Internal Medicine

## 2023-06-04 VITALS — BP 118/78 | HR 76 | Ht 71.0 in | Wt 238.8 lb

## 2023-06-04 DIAGNOSIS — K219 Gastro-esophageal reflux disease without esophagitis: Secondary | ICD-10-CM | POA: Diagnosis not present

## 2023-06-04 DIAGNOSIS — E782 Mixed hyperlipidemia: Secondary | ICD-10-CM | POA: Diagnosis not present

## 2023-06-04 DIAGNOSIS — I10 Essential (primary) hypertension: Secondary | ICD-10-CM | POA: Diagnosis not present

## 2023-06-04 DIAGNOSIS — E1165 Type 2 diabetes mellitus with hyperglycemia: Secondary | ICD-10-CM

## 2023-06-04 LAB — POCT CBG (FASTING - GLUCOSE)-MANUAL ENTRY: Glucose Fasting, POC: 115 mg/dL — AB (ref 70–99)

## 2023-06-04 MED ORDER — SEMAGLUTIDE(0.25 OR 0.5MG/DOS) 2 MG/3ML ~~LOC~~ SOPN: 0.5000 mg | PEN_INJECTOR | SUBCUTANEOUS | 3 refills | Status: DC

## 2023-06-04 NOTE — Progress Notes (Signed)
Established Patient Office Visit  Subjective:  Patient ID: Timothy Atkins, male    DOB: [birth year]  Age: 57 y.o. MRN: [id number]  Chief Complaint  Patient presents with   Follow-up    3 month follow up    Patient is is here for his follow-up.  He is generally feeling well and has lost weight since he is taking Rybelsus 3 mg/day.  His sugar control has also improved, but he continues to have some GI upset related to po Rybelsus.  Would like to switch to Ozempic injections.  Will start at 0.5 mg/week, prescription sent. Patient is fasting for blood work today.    No other concerns at this time.   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 3 severe episodes over 3 weeks    Past Surgical History:  Procedure Laterality Date   APPENDECTOMY  as child   TONSILLECTOMY  as child    Social History   Socioeconomic History   Marital status: Married    Spouse name: Not on file   Number of children: Not on file   Years of education: Not on file   Highest education level: Not on file  Occupational History   Not on file  Tobacco Use    Smoking status: Never   Smokeless tobacco: Never  Vaping Use   Vaping status: Never Used  Substance and Sexual Activity   Alcohol use: No   Drug use: No   Sexual activity: Yes  Other Topics Concern   Not on file  Social History Narrative   Married    1 daughter    Bachelors    Works in Consulting civil engineer   No guns    Wears seat belt, safe in relationship    Social Determinants of Corporate investment banker Strain: Not on file  Food Insecurity: Not on file  Transportation Needs: Not on file  Physical Activity: Not on file  Stress: Not on file  Social Connections: Not on file  Intimate Partner Violence: Not on file    Family History  Problem Relation Age of Onset   Healthy Mother    Diabetes Mother    Heart disease Mother    Hyperlipidemia Mother    Hypertension Mother    Healthy Father    Asthma Father    COPD Father    Diabetes Father    Heart disease Father    Depression Brother    Diabetes Brother    Hyperlipidemia Brother    Hypertension Brother     Allergies  Allergen Reactions   Semaglutide Diarrhea and Nausea And Vomiting    Rybelsus, caused severe abdominal pain    Review of Systems  Constitutional: Negative.   HENT: Negative.    Eyes: Negative.   Respiratory: Negative.  Negative for cough and shortness of breath.   Cardiovascular: Negative.  Negative for chest pain, palpitations and leg swelling.  Gastrointestinal: Negative.  Negative for abdominal pain, constipation, diarrhea, heartburn, nausea and vomiting.  Genitourinary: Negative.  Negative for dysuria and flank pain.  Musculoskeletal: Negative.  Negative for joint pain and myalgias.  Skin: Negative.   Neurological: Negative.  Negative for dizziness and headaches.  Endo/Heme/Allergies: Negative.   Psychiatric/Behavioral: Negative.  Negative for depression and suicidal ideas. The patient is not nervous/anxious.        Objective:   BP 118/78   Pulse 76   Ht 5\' 11"  (1.803 m)   Wt 238 lb 12.8 oz  (108.3 kg)   SpO2 96%   BMI 33.31 kg/m   Vitals:   06/04/23 1021  BP: 118/78  Pulse: 76  Height: 5\' 11"  (1.803 m)  Weight: 238 lb 12.8 oz (108.3 kg)  SpO2: 96%  BMI (Calculated): 33.32    Physical Exam Vitals and nursing note reviewed.  Constitutional:      Appearance: Normal appearance.  HENT:     Head: Normocephalic and atraumatic.     Nose: Nose normal.     Mouth/Throat:     Mouth: Mucous membranes are moist.     Pharynx: Oropharynx is clear.  Eyes:     Conjunctiva/sclera: Conjunctivae normal.     Pupils: Pupils are equal, round, and reactive to light.  Cardiovascular:     Rate and Rhythm: Normal rate and regular rhythm.     Pulses: Normal pulses.     Heart sounds: Normal heart sounds.  Pulmonary:     Effort: Pulmonary effort is normal.     Breath sounds: Normal breath sounds.  Abdominal:     General: Bowel sounds are normal.     Palpations: Abdomen is soft.  Musculoskeletal:        General: Normal range of motion.     Cervical back: Normal range of motion.  Skin:    General: Skin is warm and dry.  Neurological:     General: No focal deficit present.     Mental Status: He is alert and oriented to person, place, and time.  Psychiatric:        Mood and Affect: Mood normal.        Behavior: Behavior normal.        Judgment: Judgment normal.      Results for orders placed or performed in visit on 06/04/23  POCT CBG (Fasting - Glucose)  Result Value Ref Range   Glucose Fasting, POC 115 (A) 70 - 99 mg/dL    Recent Results (from the past 2160 hour(s))  POCT CBG (Fasting - Glucose)     Status: Abnormal   Collection Time: 06/04/23 10:34 AM  Result Value Ref Range   Glucose Fasting, POC 115 (A) 70 - 99 mg/dL      Assessment & Plan:  Check fasting labs. Switch to Ozempic injections 0.5 mg/week.  Stop Rybelsus. Problem List Items Addressed This Visit     Esophageal reflux   Relevant Medications   DEXILANT 60 MG capsule   Hyperlipidemia   Relevant  Orders   Lipid Panel w/o Chol/HDL Ratio   Type 2 diabetes mellitus with hyperglycemia, without long-term current  use of insulin (HCC) - Primary   Relevant Medications   Semaglutide,0.25 or 0.5MG /DOS, 2 MG/3ML SOPN   Other Relevant Orders   POCT CBG (Fasting - Glucose) (Completed)   Hemoglobin A1c   Essential hypertension, benign   Relevant Orders   CMP14+EGFR    Return in about 3 months (around 09/04/2023).   Total time spent: 30 minutes  Margaretann Loveless, MD  06/04/2023   This document may have been prepared by [facility] Voice Recognition software and as such may include unintentional dictation errors.

## 2023-06-10 NOTE — Progress Notes (Signed)
Patient notified

## 2023-08-05 ENCOUNTER — Telehealth: Payer: Self-pay | Admitting: Internal Medicine

## 2023-08-05 ENCOUNTER — Other Ambulatory Visit: Payer: Self-pay | Admitting: Internal Medicine

## 2023-08-05 DIAGNOSIS — J0110 Acute frontal sinusitis, unspecified: Secondary | ICD-10-CM

## 2023-08-05 MED ORDER — AMOXICILLIN-POT CLAVULANATE 875-125 MG PO TABS: 1.0000 | ORAL_TABLET | Freq: Two times a day (BID) | ORAL | 0 refills | Status: DC

## 2023-08-05 NOTE — Telephone Encounter (Signed)
Patient called in c/o congestion, sore throat, yellow mucus coming out of nose. No cough. Started a few days ago. Negative covid test one day ago. Would like something called into pharmacy.  [facility] [address]

## 2023-08-20 ENCOUNTER — Ambulatory Visit (INDEPENDENT_AMBULATORY_CARE_PROVIDER_SITE_OTHER): Payer: Managed Care, Other (non HMO) | Admitting: Cardiology

## 2023-08-20 ENCOUNTER — Encounter: Payer: Self-pay | Admitting: Cardiology

## 2023-08-20 ENCOUNTER — Other Ambulatory Visit: Payer: Self-pay | Admitting: Cardiology

## 2023-08-20 ENCOUNTER — Ambulatory Visit: Payer: Managed Care, Other (non HMO) | Admitting: Cardiology

## 2023-08-20 VITALS — BP 126/84 | HR 78 | Ht 70.0 in | Wt 236.2 lb

## 2023-08-20 DIAGNOSIS — E1165 Type 2 diabetes mellitus with hyperglycemia: Secondary | ICD-10-CM | POA: Diagnosis not present

## 2023-08-20 DIAGNOSIS — R197 Diarrhea, unspecified: Secondary | ICD-10-CM | POA: Diagnosis not present

## 2023-08-20 DIAGNOSIS — Z0130 Encounter for examination of blood pressure without abnormal findings: Secondary | ICD-10-CM

## 2023-08-20 MED ORDER — SEMAGLUTIDE(0.25 OR 0.5MG/DOS) 2 MG/3ML ~~LOC~~ SOPN: 0.5000 mg | PEN_INJECTOR | SUBCUTANEOUS | 3 refills | Status: DC

## 2023-08-20 NOTE — Progress Notes (Signed)
Established Patient Office Visit  Subjective:  Patient ID: Timothy Atkins, male    DOB: 1966/08/31  Age: 57 y.o. MRN: [id number]  Chief Complaint  Patient presents with   Follow-up    3 month follow up, loose stools x 3 weeks.    Patient in office complaining of frequent loose stools, light abdominal pain. Patient was started on Augmentin on 07/1023 for sinus infection. Patient reports eating yogurt during duration of antibiotic. Since finishing antibiotic, patient has been experiencing frequent loose stools. Did have nausea one day, relieved with antinausea medication. Patient has not taken any antidiarrheal medications. Patient reports he has continued to eat yogurt daily. Will order lab work today and will check for C. Diff. Recommend using OTC Imodium. Drink plenty of fluids, alternating water and an electrolyte replacement.   Diarrhea  This is a new problem. The current episode started 1 to 4 weeks ago. The problem occurs 2 to 4 times per day. The problem has been waxing and waning. The stool consistency is described as Watery. Associated symptoms include abdominal pain and chills. Pertinent negatives include no fever, headaches, myalgias or vomiting. Associated symptoms comments: nausea. Nothing aggravates the symptoms. Risk factors include recent antibiotic use. He has tried nothing for the symptoms. The treatment provided no relief.    No other concerns at this time.   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 3 severe episodes over 3 weeks    Past Surgical History:  Procedure Laterality Date   APPENDECTOMY  as child   TONSILLECTOMY  as child    Social History   Socioeconomic History   Marital status: Married    Spouse name: Not on file   Number of children: Not on file   Years of education: Not on file   Highest education level: Not on file  Occupational History   Not on file  Tobacco Use   Smoking status: Never   Smokeless tobacco: Never  Vaping Use   Vaping status: Never Used  Substance and Sexual Activity   Alcohol use: No   Drug use: No   Sexual activity: Yes  Other Topics Concern   Not on file  Social History Narrative   Married    1 daughter    Bachelors    Works in IT   No guns    Wears seat belt, safe in relationship    Social Determinants of Corporate investment banker Strain: Not on file  Food Insecurity: Not on file  Transportation Needs: Not on file  Physical Activity: Not on file  Stress: Not on file  Social Connections: Not on file  Intimate Partner Violence: Not on file    Family History  Problem Relation Age of Onset   Healthy Mother    Diabetes Mother    Heart disease Mother    Hyperlipidemia Mother    Hypertension Mother    Healthy Father    Asthma Father    COPD Father    Diabetes Father    Heart disease Father    Depression Brother    Diabetes Brother    Hyperlipidemia Brother    Hypertension Brother     Allergies  Allergen Reactions   Semaglutide Diarrhea and Nausea And Vomiting    Rybelsus, caused severe abdominal pain    Review of Systems   Constitutional:  Positive for chills. Negative for fever.  HENT: Negative.    Eyes: Negative.   Respiratory: Negative.  Negative for shortness of breath.   Cardiovascular: Negative.  Negative for chest pain.  Gastrointestinal:  Positive for abdominal pain and diarrhea. Negative for constipation and vomiting.  Genitourinary: Negative.   Musculoskeletal:  Negative for joint pain and myalgias.  Skin: Negative.   Neurological: Negative.  Negative for dizziness and headaches.  Endo/Heme/Allergies: Negative.   All other systems reviewed and are negative.      Objective:   BP 126/84   Pulse 78   Ht 5\' 10"  (1.778 m)   Wt 236 lb 3.2 oz (107.1 kg)   SpO2 98%   BMI 33.89 kg/m   Vitals:   08/20/23 1308  BP: 126/84  Pulse: 78  Height: 5\' 10"  (1.778 m)  Weight: 236 lb 3.2 oz (107.1 kg)  SpO2: 98%  BMI (Calculated): 33.89    Physical Exam Nursing note reviewed.  Constitutional:      Appearance: Normal appearance. He is normal weight.  HENT:     Head: Normocephalic and atraumatic.     Nose: Nose normal.     Mouth/Throat:     Mouth: Mucous membranes are moist.     Pharynx: Oropharynx is clear.  Eyes:     Extraocular Movements: Extraocular movements intact.     Conjunctiva/sclera: Conjunctivae normal.     Pupils: Pupils are equal, round, and reactive to light.  Cardiovascular:     Rate and Rhythm: Normal rate and regular rhythm.     Pulses: Normal pulses.     Heart sounds: Normal heart sounds.  Pulmonary:     Effort: Pulmonary effort is normal.     Breath sounds: Normal breath sounds.  Abdominal:     General: Abdomen is flat. Bowel sounds are normal.     Palpations: Abdomen is soft.  Musculoskeletal:        General: Normal range of motion.     Cervical back: Normal range of motion.  Skin:    General: Skin is warm and dry.  Neurological:     General: No focal deficit present.     Mental Status: He is alert and oriented to person, place, and time.  Psychiatric:         Mood and Affect: Mood normal.        Behavior: Behavior normal.        Thought Content: Thought content normal.        Judgment: Judgment normal.      No results found for any visits on 08/20/23.  Recent Results (from the past 2160 hour(s))  POCT CBG (Fasting - Glucose)  Status: Abnormal   Collection Time: 06/04/23 10:34 AM  Result Value Ref Range   Glucose Fasting, POC 115 (A) 70 - 99 mg/dL  [id number]     Status: Abnormal   Collection Time: 06/04/23 11:50 AM  Result Value Ref Range   Glucose 104 (H) 70 - 99 mg/dL   BUN 14 6 - 24 mg/dL   Creatinine, Ser 8.65 0.76 - 1.27 mg/dL   eGFR 784 >69 GE/XBM/8.41   BUN/Creatinine Ratio 17 9 - 20   Sodium 139 134 - 144 mmol/L   Potassium 4.6 3.5 - 5.2 mmol/L   Chloride 102 96 - 106 mmol/L   CO2 22 20 - 29 mmol/L   Calcium 9.8 8.7 - 10.2 mg/dL   Total Protein 7.2 6.0 - 8.5 g/dL   Albumin 4.7 3.8 - 4.9 g/dL   Globulin, Total 2.5 1.5 - 4.5 g/dL   Bilirubin Total 0.5 0.0 - 1.2 mg/dL   Alkaline Phosphatase 77 44 - 121 IU/L   AST 23 0 - 40 IU/L   ALT 33 0 - 44 IU/L  Lipid Panel w/o Chol/HDL Ratio     Status: None   Collection Time: 06/04/23 11:50 AM  Result Value Ref Range   Cholesterol, Total 173 100 - 199 mg/dL   Triglycerides 324 0 - 149 mg/dL   HDL 56 >40 mg/dL   VLDL Cholesterol Cal 19 5 - 40 mg/dL   LDL Chol Calc (NIH) 98 0 - 99 mg/dL  Hemoglobin N0U     Status: Abnormal   Collection Time: 06/04/23 11:50 AM  Result Value Ref Range   Hgb A1c MFr Bld 6.0 (H) 4.8 - 5.6 %    Comment:          Prediabetes: 5.7 - 6.4          Diabetes: >6.4          Glycemic control for adults with diabetes: <7.0    Est. average glucose Bld gHb Est-mCnc 126 mg/dL      Assessment & Plan:  Blood work today, test for C. Diff.  OTC imodium  Drink plenty of water, electrolyte replacement.  Continue yogurt or a probiotic.   Problem List Items Addressed This Visit       Endocrine   Type 2 diabetes mellitus with hyperglycemia, without  long-term current use of insulin (HCC)   Relevant Medications   Semaglutide,0.25 or 0.5MG /DOS, 2 MG/3ML SOPN   Other Visit Diagnoses     Diarrhea, unspecified type    -  Primary   Relevant Orders   CMP14+EGFR   CBC with Differential/Platelet   Clostridium difficile Toxin A/B       Return in about 2 weeks (around 09/03/2023) for NK.   Total time spent: 25 minutes  [employer], NP  08/20/2023   This document may have been prepared by Dragon Voice Recognition software and as such may include unintentional dictation errors.

## 2023-08-21 LAB — CMP14+EGFR
ALT: 25 [IU]/L (ref 0–44)
AST: 21 [IU]/L (ref 0–40)
Albumin: 4.5 g/dL (ref 3.8–4.9)
Alkaline Phosphatase: 72 [IU]/L (ref 44–121)
BUN/Creatinine Ratio: 11 (ref 9–20)
BUN: 10 mg/dL (ref 6–24)
Bilirubin Total: 0.5 mg/dL (ref 0.0–1.2)
CO2: 21 mmol/L (ref 20–29)
Calcium: 9.7 mg/dL (ref 8.7–10.2)
Chloride: 105 mmol/L (ref 96–106)
Creatinine, Ser: 0.9 mg/dL (ref 0.76–1.27)
Globulin, Total: 2.6 g/dL (ref 1.5–4.5)
Glucose: 103 mg/dL — ABNORMAL HIGH (ref 70–99)
Potassium: 4.4 mmol/L (ref 3.5–5.2)
Sodium: 141 mmol/L (ref 134–144)
Total Protein: 7.1 g/dL (ref 6.0–8.5)
eGFR: 101 mL/min/{1.73_m2} (ref >59)

## 2023-08-21 LAB — CBC WITH DIFFERENTIAL/PLATELET
Basophils Absolute: 0.1 10*3/uL (ref 0.0–0.2)
Basos: 1 %
EOS (ABSOLUTE): 0.3 10*3/uL (ref 0.0–0.4)
Eos: 4 %
Hematocrit: 41.9 % (ref 37.5–51.0)
Hemoglobin: 13.7 g/dL (ref 13.0–17.7)
Immature Grans (Abs): 0 10*3/uL (ref 0.0–0.1)
Immature Granulocytes: 0 %
Lymphocytes Absolute: 4.1 10*3/uL — ABNORMAL HIGH (ref 0.7–3.1)
Lymphs: 45 %
MCH: 29.1 pg (ref 26.6–33.0)
MCHC: 32.7 g/dL (ref 31.5–35.7)
MCV: 89 fL (ref 79–97)
Monocytes Absolute: 0.5 10*3/uL (ref 0.1–0.9)
Monocytes: 5 %
Neutrophils Absolute: 4.1 10*3/uL (ref 1.4–7.0)
Neutrophils: 45 %
Platelets: 270 10*3/uL (ref 150–450)
RBC: 4.7 x10E6/uL (ref 4.14–5.80)
RDW: 12.5 % (ref 11.6–15.4)
WBC: 9 10*3/uL (ref 3.4–10.8)

## 2023-08-26 LAB — CLOSTRIDIUM DIFFICILE EIA: C difficile Toxins A+B, EIA: NEGATIVE

## 2023-09-03 ENCOUNTER — Ambulatory Visit (INDEPENDENT_AMBULATORY_CARE_PROVIDER_SITE_OTHER): Payer: Managed Care, Other (non HMO) | Admitting: Internal Medicine

## 2023-09-03 ENCOUNTER — Ambulatory Visit: Payer: Managed Care, Other (non HMO) | Admitting: Internal Medicine

## 2023-09-03 ENCOUNTER — Encounter: Payer: Self-pay | Admitting: Internal Medicine

## 2023-09-03 VITALS — BP 118/80 | HR 75 | Ht 70.0 in | Wt 237.2 lb

## 2023-09-03 DIAGNOSIS — E782 Mixed hyperlipidemia: Secondary | ICD-10-CM

## 2023-09-03 DIAGNOSIS — K219 Gastro-esophageal reflux disease without esophagitis: Secondary | ICD-10-CM

## 2023-09-03 DIAGNOSIS — I152 Hypertension secondary to endocrine disorders: Secondary | ICD-10-CM | POA: Insufficient documentation

## 2023-09-03 DIAGNOSIS — E1159 Type 2 diabetes mellitus with other circulatory complications: Secondary | ICD-10-CM | POA: Diagnosis not present

## 2023-09-03 DIAGNOSIS — E66812 Obesity, class 2: Secondary | ICD-10-CM

## 2023-09-03 DIAGNOSIS — E1165 Type 2 diabetes mellitus with hyperglycemia: Secondary | ICD-10-CM | POA: Diagnosis not present

## 2023-09-03 DIAGNOSIS — E1169 Type 2 diabetes mellitus with other specified complication: Secondary | ICD-10-CM | POA: Insufficient documentation

## 2023-09-03 DIAGNOSIS — Z6837 Body mass index (BMI) 37.0-37.9, adult: Secondary | ICD-10-CM

## 2023-09-03 LAB — POC CREATINE & ALBUMIN,URINE
Albumin/Creatinine Ratio, Urine, POC: <30
Creatinine, POC: 300 mg/dL
Microalbumin Ur, POC: 30 mg/L

## 2023-09-03 LAB — POCT CBG (FASTING - GLUCOSE)-MANUAL ENTRY: Glucose Fasting, POC: 136 mg/dL — AB (ref 70–99)

## 2023-09-03 NOTE — Addendum Note (Signed)
Addended byKatherine Mantle on: 09/03/2023 04:04 PM   Modules accepted: Orders

## 2023-09-03 NOTE — Progress Notes (Signed)
Established Patient Office Visit  Subjective:  Patient ID: Timothy Atkins, male    DOB: 07-15-1966  Age: 57 y.o. MRN: [id number]  Chief Complaint  Patient presents with   Follow-up    2 week follow up    Patient comes in for his follow-up today.  His diarrhea and abdominal symptoms have resolved completely.  Labs were unremarkable and stool was negative for C. Difficile.  Currently he feels well and is taking all his medications as prescribed.  He just got his prescription for Ozempic as there was a confusion about the insurance.  He will continue taking oral Rybelsus in the meantime.  But it has been sorted out and will start taking Ozempic injections weekly. Need to check his lipids and hemoglobin A1c today.    No other concerns at this time.   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 3 severe episodes over 3 weeks    Past Surgical History:  Procedure Laterality Date   APPENDECTOMY  as child   TONSILLECTOMY  as child    Social History   Socioeconomic History   Marital status: Married    Spouse name: Not on file    Number of children: Not on file   Years of education: Not on file   Highest education level: Not on file  Occupational History   Not on file  Tobacco Use   Smoking status: Never   Smokeless tobacco: Never  Vaping Use   Vaping status: Never Used  Substance and Sexual Activity   Alcohol use: No   Drug use: No   Sexual activity: Yes  Other Topics Concern   Not on file  Social History Narrative   Married    1 daughter    Bachelors    Works in Consulting civil engineer   No guns    Wears seat belt, safe in relationship    Social Determinants of Corporate investment banker Strain: Not on file  Food Insecurity: Not on file  Transportation Needs: Not on file  Physical Activity: Not on file  Stress: Not on file  Social Connections: Not on file  Intimate Partner Violence: Not on file    Family History  Problem Relation Age of Onset   Healthy Mother    Diabetes Mother    Heart disease Mother    Hyperlipidemia Mother    Hypertension Mother    Healthy Father    Asthma Father    COPD Father    Diabetes Father    Heart disease Father  Depression Brother    Diabetes Brother    Hyperlipidemia Brother    Hypertension Brother     Allergies  Allergen Reactions   Semaglutide Diarrhea and Nausea And Vomiting    Rybelsus, caused severe abdominal pain    Review of Systems  Constitutional: Negative.  Negative for chills, fever, malaise/fatigue and weight loss.  HENT: Negative.    Eyes: Negative.   Respiratory: Negative.  Negative for cough and shortness of breath.   Cardiovascular: Negative.  Negative for chest pain, palpitations and leg swelling.  Gastrointestinal: Negative.  Negative for abdominal pain, blood in stool, constipation, diarrhea, heartburn, melena, nausea and vomiting.  Genitourinary: Negative.  Negative for dysuria and flank pain.  Musculoskeletal: Negative.  Negative for joint pain and myalgias.  Skin: Negative.   Neurological: Negative.  Negative for dizziness and headaches.   Endo/Heme/Allergies: Negative.   Psychiatric/Behavioral: Negative.  Negative for depression and suicidal ideas. The patient is not nervous/anxious.        Objective:   BP 118/80   Pulse 75   Ht 5\' 10"  (1.778 m)   Wt 237 lb 3.2 oz (107.6 kg)   SpO2 98%   BMI 34.03 kg/m   Vitals:   09/03/23 1011  BP: 118/80  Pulse: 75  Height: 5\' 10"  (1.778 m)  Weight: 237 lb 3.2 oz (107.6 kg)  SpO2: 98%  BMI (Calculated): 34.03    Physical Exam Vitals and nursing note reviewed.  Constitutional:      Appearance: Normal appearance.  HENT:     Head: Normocephalic and atraumatic.     Nose: Nose normal.     Mouth/Throat:     Mouth: Mucous membranes are moist.     Pharynx: Oropharynx is clear.  Eyes:     Conjunctiva/sclera: Conjunctivae normal.     Pupils: Pupils are equal, round, and reactive to light.  Cardiovascular:     Rate and Rhythm: Normal rate and regular rhythm.     Pulses: Normal pulses.     Heart sounds: Normal heart sounds.  Pulmonary:     Effort: Pulmonary effort is normal.     Breath sounds: Normal breath sounds.  Abdominal:     General: Bowel sounds are normal.     Palpations: Abdomen is soft.  Musculoskeletal:        General: Normal range of motion.     Cervical back: Normal range of motion.  Skin:    General: Skin is warm and dry.  Neurological:     General: No focal deficit present.     Mental Status: He is alert and oriented to person, place, and time.  Psychiatric:        Mood and Affect: Mood normal.        Behavior: Behavior normal.        Judgment: Judgment normal.      Results for orders placed or performed in visit on 09/03/23  POCT CBG (Fasting - Glucose)  Result Value Ref Range   Glucose Fasting, POC 136 (A) 70 - 99 mg/dL    Recent Results (from the past 2160 hour(s))  CMP14+EGFR     Status: Abnormal   Collection Time: 08/20/23  1:59 PM  Result Value Ref Range   Glucose 103 (H) 70 - 99 mg/dL   BUN 10 6 - 24 mg/dL   Creatinine, Ser  9.14 0.76 - 1.27 mg/dL   eGFR 782 >95 AO/ZHY/8.65   BUN/Creatinine Ratio 11 9 - 20   Sodium 141 134 - 144  mmol/L   Potassium 4.4 3.5 - 5.2 mmol/L   Chloride 105 96 - 106 mmol/L   CO2 21 20 - 29 mmol/L   Calcium 9.7 8.7 - 10.2 mg/dL   Total Protein 7.1 6.0 - 8.5 g/dL   Albumin 4.5 3.8 - 4.9 g/dL   Globulin, Total 2.6 1.5 - 4.5 g/dL   Bilirubin Total 0.5 0.0 - 1.2 mg/dL   Alkaline Phosphatase 72 44 - 121 IU/L   AST 21 0 - 40 IU/L   ALT 25 0 - 44 IU/L  CBC with Differential/Platelet     Status: Abnormal   Collection Time: 08/20/23  1:59 PM  Result Value Ref Range   WBC 9.0 3.4 - 10.8 x10E3/uL   RBC 4.70 4.14 - 5.80 x10E6/uL   Hemoglobin 13.7 13.0 - 17.7 g/dL   Hematocrit 72.5 36.6 - 51.0 %   MCV 89 79 - 97 fL   MCH 29.1 26.6 - 33.0 pg   MCHC 32.7 31.5 - 35.7 g/dL   RDW 44.0 34.7 - 42.5 %   Platelets 270 150 - 450 x10E3/uL   Neutrophils 45 Not Estab. %   Lymphs 45 Not Estab. %   Monocytes 5 Not Estab. %   Eos 4 Not Estab. %   Basos 1 Not Estab. %   Neutrophils Absolute 4.1 1.4 - 7.0 x10E3/uL   Lymphocytes Absolute 4.1 (H) 0.7 - 3.1 x10E3/uL   Monocytes Absolute 0.5 0.1 - 0.9 x10E3/uL   EOS (ABSOLUTE) 0.3 0.0 - 0.4 x10E3/uL   Basophils Absolute 0.1 0.0 - 0.2 x10E3/uL   Immature Granulocytes 0 Not Estab. %   Immature Grans (Abs) 0.0 0.0 - 0.1 x10E3/uL  Clostridium difficile Toxin A/B     Status: None   Collection Time: 08/24/23  1:17 PM   Specimen: Stool   ST  Result Value Ref Range   C difficile Toxins A+B, EIA Negative Negative  POCT CBG (Fasting - Glucose)     Status: Abnormal   Collection Time: 09/03/23 10:22 AM  Result Value Ref Range   Glucose Fasting, POC 136 (A) 70 - 99 mg/dL      Assessment & Plan:  Patient advised to continue taking all his medications.  Continue strict diet control and exercise regimen.  Also start taking Ozempic once available.  Labs today Problem List Items Addressed This Visit     Esophageal reflux   Hyperlipidemia   Relevant Orders    Lipid Panel w/o Chol/HDL Ratio   Obesity   Type 2 diabetes mellitus with hyperglycemia, without long-term current use of insulin (HCC)   Relevant Orders   POCT CBG (Fasting - Glucose) (Completed)   Hemoglobin A1c   Lipid Panel w/o Chol/HDL Ratio   Combined hyperlipidemia associated with type 2 diabetes mellitus (HCC)   Hypertension associated with diabetes (HCC) - Primary    Return in about 3 months (around 12/04/2023).   Total time spent: 30 minutes  Margaretann Loveless, MD  09/03/2023   This document may have been prepared by [facility] Voice Recognition software and as such may include unintentional dictation errors.

## 2023-09-04 LAB — LIPID PANEL W/O CHOL/HDL RATIO
Cholesterol, Total: 144 mg/dL (ref 100–199)
HDL: 46 mg/dL (ref >39)
LDL Chol Calc (NIH): 75 mg/dL (ref 0–99)
Triglycerides: 127 mg/dL (ref 0–149)
VLDL Cholesterol Cal: 23 mg/dL (ref 5–40)

## 2023-09-04 LAB — HEMOGLOBIN A1C
Est. average glucose Bld gHb Est-mCnc: 134 mg/dL
Hgb A1c MFr Bld: 6.3 % — ABNORMAL HIGH (ref 4.8–5.6)

## 2023-12-10 ENCOUNTER — Encounter: Payer: Self-pay | Admitting: Internal Medicine

## 2023-12-10 ENCOUNTER — Ambulatory Visit (INDEPENDENT_AMBULATORY_CARE_PROVIDER_SITE_OTHER): Payer: Managed Care, Other (non HMO) | Admitting: Internal Medicine

## 2023-12-10 VITALS — BP 122/86 | HR 68 | Ht 70.0 in | Wt 237.0 lb

## 2023-12-10 DIAGNOSIS — E782 Mixed hyperlipidemia: Secondary | ICD-10-CM

## 2023-12-10 DIAGNOSIS — E66812 Obesity, class 2: Secondary | ICD-10-CM

## 2023-12-10 DIAGNOSIS — E1169 Type 2 diabetes mellitus with other specified complication: Secondary | ICD-10-CM

## 2023-12-10 DIAGNOSIS — I10 Essential (primary) hypertension: Secondary | ICD-10-CM

## 2023-12-10 DIAGNOSIS — E1165 Type 2 diabetes mellitus with hyperglycemia: Secondary | ICD-10-CM

## 2023-12-10 DIAGNOSIS — J329 Chronic sinusitis, unspecified: Secondary | ICD-10-CM | POA: Diagnosis not present

## 2023-12-10 DIAGNOSIS — E1159 Type 2 diabetes mellitus with other circulatory complications: Secondary | ICD-10-CM

## 2023-12-10 DIAGNOSIS — I152 Hypertension secondary to endocrine disorders: Secondary | ICD-10-CM

## 2023-12-10 DIAGNOSIS — Z6837 Body mass index (BMI) 37.0-37.9, adult: Secondary | ICD-10-CM

## 2023-12-10 LAB — POCT CBG (FASTING - GLUCOSE)-MANUAL ENTRY: Glucose Fasting, POC: 141 mg/dL — AB (ref 70–99)

## 2023-12-10 MED ORDER — ONDANSETRON HCL 4 MG PO TABS: 4.0000 mg | ORAL_TABLET | Freq: Every day | ORAL | 2 refills | Status: AC | PRN

## 2023-12-10 MED ORDER — CABERGOLINE 0.5 MG PO TABS
0.5000 mg | ORAL_TABLET | ORAL | 1 refills | Status: AC
Start: 2023-12-13 — End: ?

## 2023-12-10 MED ORDER — AZELASTINE HCL 0.1 % NA SOLN: 1.0000 | Freq: Two times a day (BID) | NASAL | 2 refills | Status: AC

## 2023-12-10 MED ORDER — FLUTICASONE PROPIONATE 50 MCG/ACT NA SUSP: 1.0000 | Freq: Every day | NASAL | 2 refills | Status: DC

## 2023-12-10 MED ORDER — SEMAGLUTIDE (1 MG/DOSE) 4 MG/3ML ~~LOC~~ SOPN: 1.0000 mg | PEN_INJECTOR | SUBCUTANEOUS | 3 refills | Status: AC

## 2023-12-10 MED ORDER — CELECOXIB 200 MG PO CAPS: 200.0000 mg | ORAL_CAPSULE | Freq: Every day | ORAL | 3 refills | Status: DC | PRN

## 2023-12-10 MED ORDER — LISINOPRIL 10 MG PO TABS: 10.0000 mg | ORAL_TABLET | Freq: Every day | ORAL | 3 refills | Status: AC

## 2023-12-10 MED ORDER — BACLOFEN 10 MG PO TABS: 10.0000 mg | ORAL_TABLET | Freq: Every day | ORAL | 3 refills | Status: AC

## 2023-12-10 MED ORDER — CETIRIZINE HCL 10 MG PO TABS: 10.0000 mg | ORAL_TABLET | Freq: Every day | ORAL | 3 refills | Status: AC

## 2023-12-10 MED ORDER — METFORMIN HCL 1000 MG PO TABS: 1000.0000 mg | ORAL_TABLET | Freq: Two times a day (BID) | ORAL | 3 refills | Status: AC

## 2023-12-10 MED ORDER — ROSUVASTATIN CALCIUM 10 MG PO TABS: 10.0000 mg | ORAL_TABLET | Freq: Every day | ORAL | 3 refills | Status: AC

## 2023-12-10 MED ORDER — DEXILANT 60 MG PO CPDR: 1.0000 | DELAYED_RELEASE_CAPSULE | Freq: Every day | ORAL | 11 refills | Status: AC

## 2023-12-10 NOTE — Progress Notes (Signed)
Established Patient Office Visit  Subjective:  Patient ID: Timothy Atkins, male    DOB: 1965-12-13  Age: 58 y.o. MRN: [id number]  Chief Complaint  Patient presents with   Follow-up    3 month follow up    Patient comes in for his follow-up today.  He is generally feeling well.  Currently on Ozempic 0.5 mg/week.  Will increase to 1 mg.  Denies nausea vomiting, no abdominal pain and no constipation. Urine microalbumin checked in November 2024. Will schedule his diabetic eye exam. Patient will return fasting for labs.    No other concerns at this time.   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 3 severe episodes over 3 weeks    Past Surgical History:  Procedure Laterality Date   APPENDECTOMY  as child   TONSILLECTOMY  as child    Social History   Socioeconomic History   Marital status: Married    Spouse name: Not on file   Number of children: Not on file   Years of education: Not on file   Highest education level: Not on file  Occupational History   Not on file  Tobacco Use   Smoking status: Never   Smokeless  tobacco: Never  Vaping Use   Vaping status: Never Used  Substance and Sexual Activity   Alcohol use: No   Drug use: No   Sexual activity: Yes  Other Topics Concern   Not on file  Social History Narrative   Married    1 daughter    Bachelors    Works in Consulting civil engineer   No guns    Wears seat belt, safe in relationship    Social Drivers of Corporate investment banker Strain: Not on file  Food Insecurity: Not on file  Transportation Needs: Not on file  Physical Activity: Not on file  Stress: Not on file  Social Connections: Not on file  Intimate Partner Violence: Not on file    Family History  Problem Relation Age of Onset   Healthy Mother    Diabetes Mother    Heart disease Mother    Hyperlipidemia Mother    Hypertension Mother    Healthy Father    Asthma Father    COPD Father    Diabetes Father    Heart disease Father    Depression Brother    Diabetes Brother    Hyperlipidemia Brother    Hypertension Brother     Allergies  Allergen Reactions   Semaglutide Diarrhea and Nausea And Vomiting  Rybelsus, caused severe abdominal pain    Outpatient Medications Prior to Visit  Medication Sig   Multiple Vitamin (MULTIVITAMIN ADULT PO) Take by mouth.   [DISCONTINUED] azelastine (ASTELIN) 0.1 % nasal spray 1-2 SPRAYS IN EACH NOSTRIL EVERY DAY AS NEEDED FOR ALLERGY SYMPTOMS   [DISCONTINUED] baclofen (LIORESAL) 10 MG tablet Take 10 mg by mouth daily.   [DISCONTINUED] cabergoline (DOSTINEX) 0.5 MG tablet Take 0.5 mg by mouth 2 (two) times a week.   [DISCONTINUED] celecoxib (CELEBREX) 200 MG capsule Take 200 mg by mouth daily as needed for moderate pain.   [DISCONTINUED] cetirizine (ZYRTEC) 10 MG tablet TAKE 1 TABLET BY MOUTH AT BEDTIME   [DISCONTINUED] DEXILANT 60 MG capsule Take 1 capsule by mouth daily.   [DISCONTINUED] fluticasone (FLONASE) 50 MCG/ACT nasal spray USE 2 SPRAYS IN BOTH NOSTRILS DAILY AS NEEDED. NEED APPOINTMENT FOR FURTHER REFILLS   [DISCONTINUED] lisinopril  (ZESTRIL) 10 MG tablet Take 1 tablet (10 mg total) by mouth daily.   [DISCONTINUED] metFORMIN (GLUCOPHAGE) 1000 MG tablet Take 1 tablet (1,000 mg total) by mouth 2 (two) times daily with a meal.   [DISCONTINUED] ondansetron (ZOFRAN) 4 MG tablet Take 1 tablet (4 mg total) by mouth daily as needed.   [DISCONTINUED] rosuvastatin (CRESTOR) 10 MG tablet Take 1 tablet (10 mg total) by mouth daily.   [DISCONTINUED] Semaglutide,0.25 or 0.5MG /DOS, 2 MG/3ML SOPN Inject 0.5 mg into the skin once a week.   human chorionic gonadotropin (PREGNYL/NOVAREL) 10000 units injection Inject 3,000 Units into the muscle 3 (three) times a week. (Patient not taking: Reported on 09/03/2023)   No facility-administered medications prior to visit.    Review of Systems  Constitutional: Negative.  Negative for chills, fever, malaise/fatigue and weight loss.  HENT: Negative.  Negative for congestion and sinus pain.   Eyes: Negative.   Respiratory: Negative.  Negative for cough, shortness of breath and stridor.   Cardiovascular: Negative.  Negative for chest pain, palpitations and leg swelling.  Gastrointestinal: Negative.  Negative for abdominal pain, constipation, diarrhea, heartburn, nausea and vomiting.  Genitourinary: Negative.  Negative for dysuria and flank pain.  Musculoskeletal: Negative.  Negative for joint pain and myalgias.  Skin: Negative.   Neurological: Negative.  Negative for dizziness, tingling, tremors, sensory change and headaches.  Endo/Heme/Allergies: Negative.   Psychiatric/Behavioral: Negative.  Negative for depression and suicidal ideas. The patient is not nervous/anxious.        Objective:   BP 122/86   Pulse 68   Ht 5\' 10"  (1.778 m)   Wt 237 lb (107.5 kg)   SpO2 99%   BMI 34.01 kg/m   Vitals:   12/10/23 1106  BP: 122/86  Pulse: 68  Height: 5\' 10"  (1.778 m)  Weight: 237 lb (107.5 kg)  SpO2: 99%  BMI (Calculated): 34.01    Physical Exam Vitals and nursing note reviewed.   Constitutional:      Appearance: Normal appearance.  HENT:     Head: Normocephalic and atraumatic.     Nose: Nose normal.     Mouth/Throat:     Mouth: Mucous membranes are moist.     Pharynx: Oropharynx is clear.  Eyes:     Conjunctiva/sclera: Conjunctivae normal.     Pupils: Pupils are equal, round, and reactive to light.  Cardiovascular:     Rate and Rhythm: Normal rate and regular rhythm.     Pulses: Normal pulses.     Heart sounds: Normal heart sounds.  Pulmonary:     Effort: Pulmonary effort is normal.  Breath sounds: Normal breath sounds.  Abdominal:     General: Bowel sounds are normal.     Palpations: Abdomen is soft.  Musculoskeletal:        General: Normal range of motion.     Cervical back: Normal range of motion.  Skin:    General: Skin is warm and dry.  Neurological:     General: No focal deficit present.     Mental Status: He is alert and oriented to person, place, and time.  Psychiatric:        Mood and Affect: Mood normal.        Behavior: Behavior normal.        Judgment: Judgment normal.      Results for orders placed or performed in visit on 12/10/23  POCT CBG (Fasting - Glucose)  Result Value Ref Range   Glucose Fasting, POC 141 (A) 70 - 99 mg/dL    Recent Results (from the past 2160 hours)  POCT CBG (Fasting - Glucose)     Status: Abnormal   Collection Time: 12/10/23 11:11 AM  Result Value Ref Range   Glucose Fasting, POC 141 (A) 70 - 99 mg/dL      Assessment & Plan:  Increase Ozempic to 1 mg/week.  Continue rest of medications.  Will get fasting blood work. His PHQ-9/GAD score is high.  Will discuss starting antidepressant at follow-up. Problem List Items Addressed This Visit     Sinusitis   Relevant Medications   cetirizine (ZYRTEC) 10 MG tablet   fluticasone (FLONASE) 50 MCG/ACT nasal spray   azelastine (ASTELIN) 0.1 % nasal spray   Obesity   Relevant Medications   Semaglutide, 1 MG/DOSE, 4 MG/3ML SOPN   metFORMIN  (GLUCOPHAGE) 1000 MG tablet   Type 2 diabetes mellitus with hyperglycemia, without long-term current use of insulin (HCC) - Primary   Relevant Medications   Semaglutide, 1 MG/DOSE, 4 MG/3ML SOPN   lisinopril (ZESTRIL) 10 MG tablet   metFORMIN (GLUCOPHAGE) 1000 MG tablet   rosuvastatin (CRESTOR) 10 MG tablet   Other Relevant Orders   POCT CBG (Fasting - Glucose) (Completed)   Essential hypertension, benign   Relevant Medications   lisinopril (ZESTRIL) 10 MG tablet   rosuvastatin (CRESTOR) 10 MG tablet   Combined hyperlipidemia associated with type 2 diabetes mellitus (HCC)   Relevant Medications   Semaglutide, 1 MG/DOSE, 4 MG/3ML SOPN   lisinopril (ZESTRIL) 10 MG tablet   metFORMIN (GLUCOPHAGE) 1000 MG tablet   rosuvastatin (CRESTOR) 10 MG tablet   Hypertension associated with diabetes (HCC)   Relevant Medications   Semaglutide, 1 MG/DOSE, 4 MG/3ML SOPN   lisinopril (ZESTRIL) 10 MG tablet   metFORMIN (GLUCOPHAGE) 1000 MG tablet   rosuvastatin (CRESTOR) 10 MG tablet    Return in about 4 months (around 04/08/2024).   Total time spent: 30 minutes  Margaretann Loveless, MD  12/10/2023   This document may have been prepared by [facility] Voice Recognition software and as such may include unintentional dictation errors.

## 2023-12-14 ENCOUNTER — Other Ambulatory Visit: Payer: Managed Care, Other (non HMO)

## 2023-12-15 LAB — CMP14+EGFR
ALT: 24 [IU]/L (ref 0–44)
AST: 21 [IU]/L (ref 0–40)
Albumin: 4.5 g/dL (ref 3.8–4.9)
Alkaline Phosphatase: 97 [IU]/L (ref 44–121)
BUN/Creatinine Ratio: 14 (ref 9–20)
BUN: 15 mg/dL (ref 6–24)
Bilirubin Total: 0.6 mg/dL (ref 0.0–1.2)
CO2: 21 mmol/L (ref 20–29)
Calcium: 9.5 mg/dL (ref 8.7–10.2)
Chloride: 103 mmol/L (ref 96–106)
Creatinine, Ser: 1.09 mg/dL (ref 0.76–1.27)
Globulin, Total: 2.5 g/dL (ref 1.5–4.5)
Glucose: 128 mg/dL — ABNORMAL HIGH (ref 70–99)
Potassium: 4.2 mmol/L (ref 3.5–5.2)
Sodium: 140 mmol/L (ref 134–144)
Total Protein: 7 g/dL (ref 6.0–8.5)
eGFR: 80 mL/min/{1.73_m2} (ref >59)

## 2023-12-15 LAB — LIPID PANEL W/O CHOL/HDL RATIO
Cholesterol, Total: 167 mg/dL (ref 100–199)
HDL: 56 mg/dL (ref >39)
LDL Chol Calc (NIH): 94 mg/dL (ref 0–99)
Triglycerides: 95 mg/dL (ref 0–149)
VLDL Cholesterol Cal: 17 mg/dL (ref 5–40)

## 2023-12-15 LAB — HEMOGLOBIN A1C
Est. average glucose Bld gHb Est-mCnc: 128 mg/dL
Hgb A1c MFr Bld: 6.1 % — ABNORMAL HIGH (ref 4.8–5.6)

## 2024-04-07 ENCOUNTER — Ambulatory Visit: Payer: Managed Care, Other (non HMO) | Admitting: Family

## 2024-04-15 ENCOUNTER — Other Ambulatory Visit: Payer: Self-pay | Admitting: Internal Medicine

## 2024-04-15 DIAGNOSIS — J329 Chronic sinusitis, unspecified: Secondary | ICD-10-CM

## 2024-04-21 ENCOUNTER — Ambulatory Visit: Admitting: Internal Medicine

## 2024-05-12 ENCOUNTER — Encounter: Payer: Self-pay | Admitting: Internal Medicine

## 2024-05-12 ENCOUNTER — Ambulatory Visit: Payer: Self-pay | Admitting: Internal Medicine

## 2024-05-12 ENCOUNTER — Ambulatory Visit (INDEPENDENT_AMBULATORY_CARE_PROVIDER_SITE_OTHER): Admitting: Internal Medicine

## 2024-05-12 VITALS — BP 118/78 | HR 100 | Ht 70.0 in | Wt 227.2 lb

## 2024-05-12 DIAGNOSIS — I152 Hypertension secondary to endocrine disorders: Secondary | ICD-10-CM

## 2024-05-12 DIAGNOSIS — E1169 Type 2 diabetes mellitus with other specified complication: Secondary | ICD-10-CM

## 2024-05-12 DIAGNOSIS — G43009 Migraine without aura, not intractable, without status migrainosus: Secondary | ICD-10-CM

## 2024-05-12 DIAGNOSIS — E1159 Type 2 diabetes mellitus with other circulatory complications: Secondary | ICD-10-CM

## 2024-05-12 DIAGNOSIS — E1165 Type 2 diabetes mellitus with hyperglycemia: Secondary | ICD-10-CM | POA: Diagnosis not present

## 2024-05-12 DIAGNOSIS — E782 Mixed hyperlipidemia: Secondary | ICD-10-CM | POA: Diagnosis not present

## 2024-05-12 LAB — POCT CBG (FASTING - GLUCOSE)-MANUAL ENTRY: Glucose Fasting, POC: 128 mg/dL — AB (ref 70–99)

## 2024-05-12 NOTE — Progress Notes (Signed)
 Established Patient Office Visit  Subjective:  Patient ID: Timothy Atkins, male    DOB: 03-18-1966  Age: 58 y.o. MRN: [id number]  Chief Complaint  Patient presents with   Follow-up    4 month follow up    Patient comes in for his follow-up today.  He is generally feeling well except for a migraine headache today.  Nurtec tablets have helped him before, will give him samples today.  Has history of pituitary adenoma, recent MRI is stable.   He has lost weight on Ozempic , denies nausea or vomiting, but gets occasional constipation.   He has fasting for blood work.    No other concerns at this time.   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 3 severe episodes over 3 weeks    Past Surgical History:  Procedure Laterality Date   APPENDECTOMY  as child   TONSILLECTOMY  as child    Social History   Socioeconomic History   Marital status: Married    Spouse name: Not on file   Number of children: Not on file   Years of education: Not on file   Highest education level: Not on file  Occupational History   Not on file  Tobacco  Use   Smoking status: Never   Smokeless tobacco: Never  Vaping Use   Vaping status: Never Used  Substance and Sexual Activity   Alcohol use: No   Drug use: No   Sexual activity: Yes  Other Topics Concern   Not on file  Social History Narrative   Married    1 daughter    Bachelors    Works in Consulting civil engineer   No guns    Wears seat belt, safe in relationship    Social Drivers of Corporate investment banker Strain: Not on file  Food Insecurity: Not on file  Transportation Needs: Not on file  Physical Activity: Not on file  Stress: Not on file  Social Connections: Not on file  Intimate Partner Violence: Not on file    Family History  Problem Relation Age of Onset   Healthy Mother    Diabetes Mother    Heart disease Mother    Hyperlipidemia Mother    Hypertension Mother    Healthy Father    Asthma Father    COPD Father    Diabetes Father    Heart disease Father    Depression Brother    Diabetes Brother    Hyperlipidemia Brother    Hypertension Brother     Allergies  Allergen Reactions   Semaglutide  Diarrhea and Nausea And Vomiting    Rybelsus , caused severe abdominal pain    Outpatient Medications Prior to Visit  Medication Sig   azelastine  (ASTELIN ) 0.1 % nasal spray Place 1 spray into both nostrils 2 (two) times daily. Use in each nostril as directed   baclofen  (LIORESAL ) 10 MG tablet Take 1 tablet (10 mg total) by mouth daily.   cabergoline  (DOSTINEX ) 0.5 MG tablet Take 1 tablet (0.5 mg total) by mouth 2 (two) times a week.   celecoxib  (CELEBREX ) 200 MG capsule Take 1 capsule (200 mg total) by mouth daily as needed for moderate pain (pain score 4-6).   cetirizine  (ZYRTEC ) 10 MG tablet Take 1 tablet (10 mg total) by mouth at bedtime.   DEXILANT  60 MG capsule Take 1 capsule (60 mg total) by mouth daily.   fluticasone  (FLONASE ) 50 MCG/ACT nasal spray SHAKE LIQUID AND USE 1 SPRAY IN EACH NOSTRIL DAILY   lisinopril  (ZESTRIL ) 10 MG tablet Take 1 tablet (10 mg total) by  mouth daily.   metFORMIN  (GLUCOPHAGE ) 1000 MG tablet Take 1 tablet (1,000 mg total) by mouth 2 (two) times daily with a meal.   Multiple Vitamin (MULTIVITAMIN ADULT PO) Take by mouth.   ondansetron  (ZOFRAN ) 4 MG tablet Take 1 tablet (4 mg total) by mouth daily as needed.   rosuvastatin  (CRESTOR ) 10 MG tablet Take 1 tablet (10 mg total) by mouth daily.   Semaglutide , 1 MG/DOSE, 4 MG/3ML SOPN Inject 1 mg into the skin once a week.   human chorionic gonadotropin  (PREGNYL/NOVAREL) 10000 units injection Inject 3,000 Units into the muscle 3 (three) times a week. (Patient not taking: Reported on 05/12/2024)   No facility-administered medications prior to visit.    Review of Systems  Constitutional: Negative.  Negative for chills, diaphoresis, fever and malaise/fatigue.  HENT: Negative.  Negative for congestion, sinus pain and sore throat.   Eyes: Negative.   Respiratory: Negative.  Negative for cough and shortness of breath.   Cardiovascular: Negative.  Negative for chest pain, palpitations and leg swelling.  Gastrointestinal: Negative.  Negative for abdominal pain, constipation, diarrhea, heartburn, nausea and vomiting.  Genitourinary: Negative.  Negative for dysuria and flank pain.  Musculoskeletal: Negative.  Negative for joint pain and myalgias.  Skin: Negative.   Neurological:  Positive for headaches. Negative for dizziness and tingling.  Endo/Heme/Allergies: Negative.   Psychiatric/Behavioral: Negative.  Negative for depression and suicidal ideas. The patient is not nervous/anxious.        Objective:   BP 118/78   Pulse 100   Ht 5' 10 (1.778 m)   Wt 227 lb 3.2 oz (103.1 kg)   SpO2 97%   BMI 32.60 kg/m   Vitals:   05/12/24 1101  BP: 118/78  Pulse: 100  Height: 5' 10 (1.778 m)  Weight: 227 lb 3.2 oz (103.1 kg)  SpO2: 97%  BMI (Calculated): 32.6    Physical Exam Vitals and nursing note reviewed.  Constitutional:      Appearance: Normal appearance.  HENT:     Head:  Normocephalic and atraumatic.     Nose: Nose normal.     Mouth/Throat:     Mouth: Mucous membranes are moist.     Pharynx: Oropharynx is clear.  Eyes:     Conjunctiva/sclera: Conjunctivae normal.     Pupils: Pupils are equal, round, and reactive to light.  Cardiovascular:     Rate and Rhythm: Normal rate and regular rhythm.     Pulses: Normal  pulses.     Heart sounds: Normal heart sounds.  Pulmonary:     Effort: Pulmonary effort is normal.     Breath sounds: Normal breath sounds.  Abdominal:     General: Bowel sounds are normal.     Palpations: Abdomen is soft.  Musculoskeletal:        General: Normal range of motion.     Cervical back: Normal range of motion.  Skin:    General: Skin is warm and dry.  Neurological:     General: No focal deficit present.     Mental Status: He is alert and oriented to person, place, and time.  Psychiatric:        Mood and Affect: Mood normal.        Behavior: Behavior normal.        Judgment: Judgment normal.      Results for orders placed or performed in visit on 05/12/24  POCT CBG (Fasting - Glucose)  Result Value Ref Range   Glucose Fasting, POC 128 (A) 70 - 99 mg/dL    Recent Results (from the past 2160 hours)  POCT CBG (Fasting - Glucose)     Status: Abnormal   Collection Time: 05/12/24 11:07 AM  Result Value Ref Range   Glucose Fasting, POC 128 (A) 70 - 99 mg/dL      Assessment & Plan:  Continue current medications.  Strict diet control and exercise emphasized.  Check blood work today. Problem List Items Addressed This Visit     Type 2 diabetes mellitus with hyperglycemia, without long-term current use of insulin (HCC)   Relevant Orders   POCT CBG (Fasting - Glucose) (Completed)   Hemoglobin A1c   Combined hyperlipidemia associated with type 2 diabetes mellitus (HCC)   Relevant Orders   Lipid Panel w/o Chol/HDL Ratio   Hypertension associated with diabetes (HCC) - Primary   Relevant Orders   CMP14+EGFR   Migraine  without aura and without status migrainosus, not intractable    Follow up 4 months.  Total time spent: 30 minutes  FERNAND FREDY RAMAN, MD  05/12/2024   This document may have been prepared by [facility] Voice Recognition software and as such may include unintentional dictation errors.

## 2024-05-13 LAB — LIPID PANEL W/O CHOL/HDL RATIO
Cholesterol, Total: 145 mg/dL (ref 100–199)
HDL: 50 mg/dL (ref >39)
LDL Chol Calc (NIH): 74 mg/dL (ref 0–99)
Triglycerides: 114 mg/dL (ref 0–149)
VLDL Cholesterol Cal: 21 mg/dL (ref 5–40)

## 2024-05-13 LAB — CMP14+EGFR
ALT: 20 IU/L (ref 0–44)
AST: 21 IU/L (ref 0–40)
Albumin: 4.4 g/dL (ref 3.8–4.9)
Alkaline Phosphatase: 76 IU/L (ref 44–121)
BUN/Creatinine Ratio: 19 (ref 9–20)
BUN: 16 mg/dL (ref 6–24)
Bilirubin Total: 0.5 mg/dL (ref 0.0–1.2)
CO2: 22 mmol/L (ref 20–29)
Calcium: 9.4 mg/dL (ref 8.7–10.2)
Chloride: 103 mmol/L (ref 96–106)
Creatinine, Ser: 0.86 mg/dL (ref 0.76–1.27)
Globulin, Total: 2.5 g/dL (ref 1.5–4.5)
Glucose: 112 mg/dL — ABNORMAL HIGH (ref 70–99)
Potassium: 4.1 mmol/L (ref 3.5–5.2)
Sodium: 139 mmol/L (ref 134–144)
Total Protein: 6.9 g/dL (ref 6.0–8.5)
eGFR: 102 mL/min/1.73 (ref >59)

## 2024-05-13 LAB — HEMOGLOBIN A1C
Est. average glucose Bld gHb Est-mCnc: 128 mg/dL
Hgb A1c MFr Bld: 6.1 % — ABNORMAL HIGH (ref 4.8–5.6)

## 2024-05-15 NOTE — Progress Notes (Signed)
 Patient notified

## 2024-06-29 ENCOUNTER — Other Ambulatory Visit: Payer: Self-pay | Admitting: Internal Medicine

## 2024-07-31 ENCOUNTER — Encounter: Payer: Self-pay | Admitting: Internal Medicine

## 2024-07-31 ENCOUNTER — Ambulatory Visit: Admitting: Internal Medicine

## 2024-07-31 VITALS — BP 110/74 | HR 74 | Ht 70.0 in | Wt 225.0 lb

## 2024-07-31 DIAGNOSIS — Z6837 Body mass index (BMI) 37.0-37.9, adult: Secondary | ICD-10-CM

## 2024-07-31 DIAGNOSIS — E1169 Type 2 diabetes mellitus with other specified complication: Secondary | ICD-10-CM | POA: Diagnosis not present

## 2024-07-31 DIAGNOSIS — E1165 Type 2 diabetes mellitus with hyperglycemia: Secondary | ICD-10-CM | POA: Diagnosis not present

## 2024-07-31 DIAGNOSIS — G43009 Migraine without aura, not intractable, without status migrainosus: Secondary | ICD-10-CM

## 2024-07-31 DIAGNOSIS — E782 Mixed hyperlipidemia: Secondary | ICD-10-CM | POA: Diagnosis not present

## 2024-07-31 DIAGNOSIS — E1159 Type 2 diabetes mellitus with other circulatory complications: Secondary | ICD-10-CM

## 2024-07-31 DIAGNOSIS — I152 Hypertension secondary to endocrine disorders: Secondary | ICD-10-CM

## 2024-07-31 DIAGNOSIS — E66812 Obesity, class 2: Secondary | ICD-10-CM

## 2024-07-31 LAB — POCT CBG (FASTING - GLUCOSE)-MANUAL ENTRY: Glucose Fasting, POC: 125 mg/dL — AB (ref 70–99)

## 2024-07-31 NOTE — Progress Notes (Signed)
 Established Patient Office Visit  Subjective:  Patient ID: Timothy Atkins, male    DOB: 1966-06-27  Age: 58 y.o. MRN: [id number]  Chief Complaint  Patient presents with   Follow-up    4 month follow up    Patient comes in for his follow-up today. He is generally feels well, no acute complaints. Taking his medications regularly,except Ozempic  - not covered by his Insurance anymore. Labs not due until later on this month.    No other concerns at this time.   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 3 severe episodes over 3 weeks    Past Surgical History:  Procedure Laterality Date   APPENDECTOMY  as child   TONSILLECTOMY  as child    Social History   Socioeconomic History   Marital status: Married    Spouse name: Not on file   Number of children: Not on file   Years of education: Not on file   Highest education level: Not on file  Occupational History   Not on file  Tobacco Use   Smoking status: Never   Smokeless tobacco: Never  Vaping Use   Vaping status: Never Used  Substance and Sexual Activity   Alcohol use: No   Drug use:  No   Sexual activity: Yes  Other Topics Concern   Not on file  Social History Narrative   Married    1 daughter    Bachelors    Works in Consulting civil engineer   No guns    Wears seat belt, safe in relationship    Social Drivers of Corporate investment banker Strain: Not on file  Food Insecurity: Not on file  Transportation Needs: Not on file  Physical Activity: Not on file  Stress: Not on file  Social Connections: Not on file  Intimate Partner Violence: Not on file    Family History  Problem Relation Age of Onset   Healthy Mother    Diabetes Mother    Heart disease Mother    Hyperlipidemia Mother    Hypertension Mother    Healthy Father    Asthma Father    COPD Father    Diabetes Father    Heart disease Father    Depression Brother    Diabetes Brother    Hyperlipidemia Brother    Hypertension Brother     Allergies  Allergen Reactions   Semaglutide  Diarrhea and Nausea And Vomiting    Rybelsus , caused severe abdominal pain    Outpatient Medications Prior to Visit  Medication Sig   azelastine  (ASTELIN )  0.1 % nasal spray Place 1 spray into both nostrils 2 (two) times daily. Use in each nostril as directed   baclofen  (LIORESAL ) 10 MG tablet Take 1 tablet (10 mg total) by mouth daily.   cabergoline  (DOSTINEX ) 0.5 MG tablet Take 1 tablet (0.5 mg total) by mouth 2 (two) times a week.   celecoxib  (CELEBREX ) 200 MG capsule TAKE 1 CAPSULE(200 MG) BY MOUTH DAILY AS NEEDED FOR MODERATE PAIN   cetirizine  (ZYRTEC ) 10 MG tablet Take 1 tablet (10 mg total) by mouth at bedtime.   DEXILANT  60 MG capsule Take 1 capsule (60 mg total) by mouth daily.   fluticasone  (FLONASE ) 50 MCG/ACT nasal spray SHAKE LIQUID AND USE 1 SPRAY IN EACH NOSTRIL DAILY   human chorionic gonadotropin  (PREGNYL/NOVAREL) 10000 units injection Inject 3,000 Units into the muscle 3 (three) times a week. (Patient not taking: Reported on 05/12/2024)   lisinopril  (ZESTRIL ) 10 MG tablet Take 1 tablet (10 mg total) by mouth daily.    metFORMIN  (GLUCOPHAGE ) 1000 MG tablet Take 1 tablet (1,000 mg total) by mouth 2 (two) times daily with a meal.   Multiple Vitamin (MULTIVITAMIN ADULT PO) Take by mouth.   ondansetron  (ZOFRAN ) 4 MG tablet Take 1 tablet (4 mg total) by mouth daily as needed.   rosuvastatin  (CRESTOR ) 10 MG tablet Take 1 tablet (10 mg total) by mouth daily.   Semaglutide , 1 MG/DOSE, 4 MG/3ML SOPN Inject 1 mg into the skin once a week.   No facility-administered medications prior to visit.    Review of Systems  Constitutional: Negative.  Negative for chills, fever and malaise/fatigue.  HENT: Negative.  Negative for congestion and sore throat.   Eyes: Negative.  Negative for blurred vision and pain.  Respiratory: Negative.  Negative for cough and shortness of breath.   Cardiovascular: Negative.  Negative for chest pain, palpitations and leg swelling.  Gastrointestinal: Negative.  Negative for abdominal pain, blood in stool, constipation, diarrhea, heartburn, melena, nausea and vomiting.  Genitourinary: Negative.  Negative for dysuria, flank pain, frequency and urgency.  Musculoskeletal: Negative.  Negative for joint pain and myalgias.  Skin: Negative.   Neurological: Negative.  Negative for dizziness, tingling, sensory change, weakness and headaches.  Endo/Heme/Allergies: Negative.   Psychiatric/Behavioral: Negative.  Negative for depression and suicidal ideas. The patient is not nervous/anxious.        Objective:   BP 110/74   Pulse 74   Ht 5' 10 (1.778 m)   Wt 225 lb (102.1 kg)   SpO2 99%   BMI 32.28 kg/m   Vitals:   07/31/24 1449  BP: 110/74  Pulse: 74  Height: 5' 10 (1.778 m)  Weight: 225 lb (102.1 kg)  SpO2: 99%  BMI (Calculated): 32.28    Physical Exam Vitals and nursing note reviewed.  Constitutional:      General: He is not in acute distress.    Appearance: Normal appearance. He is not ill-appearing.  HENT:     Head: Normocephalic and atraumatic.     Nose: Nose normal.      Mouth/Throat:     Mouth: Mucous membranes are moist.     Pharynx: Oropharynx is clear.  Eyes:     Conjunctiva/sclera: Conjunctivae normal.     Pupils: Pupils are equal, round, and reactive to light.  Cardiovascular:     Rate and Rhythm: Normal rate and regular rhythm.     Pulses: Normal pulses.     Heart sounds: Normal heart sounds.  Pulmonary:     Effort:  Pulmonary effort is normal.     Breath sounds: Normal breath sounds. No wheezing or rhonchi.  Abdominal:     General: Bowel sounds are normal. There is no distension.     Palpations: Abdomen is soft.     Tenderness: There is no abdominal tenderness.  Musculoskeletal:        General: Normal range of motion.     Cervical back: Normal range of motion and neck supple.     Right lower leg: No edema.     Left lower leg: No edema.  Skin:    General: Skin is warm and dry.     Capillary Refill: Capillary refill takes less than 2 seconds.  Neurological:     General: No focal deficit present.     Mental Status: He is alert and oriented to person, place, and time.     Sensory: No sensory deficit.     Motor: No weakness.  Psychiatric:        Mood and Affect: Mood normal.        Behavior: Behavior normal.        Judgment: Judgment normal.      Results for orders placed or performed in visit on 07/31/24  POCT CBG (Fasting - Glucose)  Result Value Ref Range   Glucose Fasting, POC 125 (A) 70 - 99 mg/dL    Recent Results (from the past 2160 hours)  POCT CBG (Fasting - Glucose)     Status: Abnormal   Collection Time: 05/12/24 11:07 AM  Result Value Ref Range   Glucose Fasting, POC 128 (A) 70 - 99 mg/dL  [id number]     Status: Abnormal   Collection Time: 05/12/24 11:34 AM  Result Value Ref Range   Glucose 112 (H) 70 - 99 mg/dL   BUN 16 6 - 24 mg/dL   Creatinine, Ser 9.13 0.76 - 1.27 mg/dL   eGFR 897 >40 fO/fpw/8.26   BUN/Creatinine Ratio 19 9 - 20   Sodium 139 134 - 144 mmol/L   Potassium 4.1 3.5 - 5.2 mmol/L   Chloride 103  96 - 106 mmol/L   CO2 22 20 - 29 mmol/L   Calcium  9.4 8.7 - 10.2 mg/dL   Total Protein 6.9 6.0 - 8.5 g/dL   Albumin 4.4 3.8 - 4.9 g/dL   Globulin, Total 2.5 1.5 - 4.5 g/dL   Bilirubin Total 0.5 0.0 - 1.2 mg/dL   Alkaline Phosphatase 76 44 - 121 IU/L   AST 21 0 - 40 IU/L   ALT 20 0 - 44 IU/L  Lipid Panel w/o Chol/HDL Ratio     Status: None   Collection Time: 05/12/24 11:34 AM  Result Value Ref Range   Cholesterol, Total 145 100 - 199 mg/dL   Triglycerides 885 0 - 149 mg/dL   HDL 50 >60 mg/dL   VLDL Cholesterol Cal 21 5 - 40 mg/dL   LDL Chol Calc (NIH) 74 0 - 99 mg/dL  Hemoglobin J8r     Status: Abnormal   Collection Time: 05/12/24 11:34 AM  Result Value Ref Range   Hgb A1c MFr Bld 6.1 (H) 4.8 - 5.6 %    Comment:          Prediabetes: 5.7 - 6.4          Diabetes: >6.4          Glycemic control for adults with diabetes: <7.0    Est. average glucose Bld gHb Est-mCnc 128 mg/dL  POCT CBG (Fasting - Glucose)  Status: Abnormal   Collection Time: 07/31/24  3:00 PM  Result Value Ref Range   Glucose Fasting, POC 125 (A) 70 - 99 mg/dL      Assessment & Plan:  Continue meds, strict diet control, and exercise as tolerated. Labs ordered, can  come in fasting next month for blood draw. Problem List Items Addressed This Visit     Obesity   Type 2 diabetes mellitus with hyperglycemia, without long-term current use of insulin (HCC) - Primary   Relevant Orders   POCT CBG (Fasting - Glucose) (Completed)   Hemoglobin A1c   Combined hyperlipidemia associated with type 2 diabetes mellitus (HCC)   Relevant Orders   Lipid Panel w/o Chol/HDL Ratio   Hypertension associated with diabetes (HCC)   Relevant Orders   CMP14+EGFR   Migraine without aura and without status migrainosus, not intractable    Return in about 4 months (around 12/01/2024).   Total time spent: 25 minutes  FERNAND FREDY RAMAN, MD  07/31/2024   This document may have been prepared by [facility] Voice Recognition software and  as such may include unintentional dictation errors.

## 2024-09-15 ENCOUNTER — Ambulatory Visit: Admitting: Family

## 2024-12-01 ENCOUNTER — Ambulatory Visit: Admitting: Internal Medicine

## 2025-02-15 ENCOUNTER — Ambulatory Visit: Admitting: Internal Medicine
